# Patient Record
Sex: Female | Born: 1985 | Race: White | Hispanic: No | Marital: Married | Smoking: Never smoker
Health system: Southern US, Community
[De-identification: ages and names within clinical notes are randomized; demographics above are authoritative.]

## PROBLEM LIST (undated history)

## (undated) ENCOUNTER — Inpatient Hospital Stay (HOSPITAL_COMMUNITY): Payer: Self-pay

## (undated) DIAGNOSIS — Z789 Other specified health status: Secondary | ICD-10-CM

## (undated) HISTORY — PX: TONSILLECTOMY: SUR1361

---

## 2015-10-30 ENCOUNTER — Inpatient Hospital Stay (HOSPITAL_COMMUNITY)
Admission: AD | Admit: 2015-10-30 | Discharge: 2015-10-30 | Disposition: A | Discharge status: Home / Self Care | Payer: Medicaid Other | Source: Ambulatory Visit | Attending: Family Medicine | Admitting: Family Medicine

## 2015-10-30 ENCOUNTER — Inpatient Hospital Stay (HOSPITAL_COMMUNITY): Payer: Medicaid Other

## 2015-10-30 ENCOUNTER — Encounter (HOSPITAL_COMMUNITY): Payer: Self-pay | Admitting: *Deleted

## 2015-10-30 DIAGNOSIS — Z3A01 Less than 8 weeks gestation of pregnancy: Secondary | ICD-10-CM | POA: Diagnosis not present

## 2015-10-30 DIAGNOSIS — R109 Unspecified abdominal pain: Secondary | ICD-10-CM | POA: Diagnosis not present

## 2015-10-30 DIAGNOSIS — K5900 Constipation, unspecified: Secondary | ICD-10-CM | POA: Insufficient documentation

## 2015-10-30 DIAGNOSIS — O99611 Diseases of the digestive system complicating pregnancy, first trimester: Secondary | ICD-10-CM | POA: Insufficient documentation

## 2015-10-30 DIAGNOSIS — O9989 Other specified diseases and conditions complicating pregnancy, childbirth and the puerperium: Secondary | ICD-10-CM | POA: Diagnosis not present

## 2015-10-30 DIAGNOSIS — R1031 Right lower quadrant pain: Secondary | ICD-10-CM | POA: Diagnosis not present

## 2015-10-30 DIAGNOSIS — O26891 Other specified pregnancy related conditions, first trimester: Secondary | ICD-10-CM | POA: Diagnosis not present

## 2015-10-30 DIAGNOSIS — O26899 Other specified pregnancy related conditions, unspecified trimester: Secondary | ICD-10-CM

## 2015-10-30 DIAGNOSIS — Z3491 Encounter for supervision of normal pregnancy, unspecified, first trimester: Secondary | ICD-10-CM

## 2015-10-30 HISTORY — DX: Other specified health status: Z78.9

## 2015-10-30 LAB — URINALYSIS, ROUTINE W REFLEX MICROSCOPIC
BILIRUBIN URINE: NEGATIVE
GLUCOSE, UA: NEGATIVE mg/dL
Hgb urine dipstick: NEGATIVE
KETONES UR: NEGATIVE mg/dL
Nitrite: NEGATIVE
PH: 5.5 (ref 5.0–8.0)
PROTEIN: NEGATIVE mg/dL
Specific Gravity, Urine: 1.025 (ref 1.005–1.030)

## 2015-10-30 LAB — URINE MICROSCOPIC-ADD ON: RBC / HPF: NONE SEEN RBC/hpf (ref 0–5)

## 2015-10-30 LAB — CBC
HCT: 37.6 % (ref 36.0–46.0)
Hemoglobin: 13.3 g/dL (ref 12.0–15.0)
MCH: 29.5 pg (ref 26.0–34.0)
MCHC: 35.4 g/dL (ref 30.0–36.0)
MCV: 83.4 fL (ref 78.0–100.0)
PLATELETS: 194 10*3/uL (ref 150–400)
RBC: 4.51 MIL/uL (ref 3.87–5.11)
RDW: 13.2 % (ref 11.5–15.5)
WBC: 7.7 10*3/uL (ref 4.0–10.5)

## 2015-10-30 LAB — POCT PREGNANCY, URINE: Preg Test, Ur: POSITIVE — AB

## 2015-10-30 LAB — HCG, QUANTITATIVE, PREGNANCY: hCG, Beta Chain, Quant, S: 13171 m[IU]/mL — ABNORMAL HIGH (ref <5)

## 2015-10-30 MED ORDER — DOCUSATE SODIUM 100 MG PO CAPS: 100.0000 mg | ORAL_CAPSULE | Freq: Two times a day (BID) | ORAL | 0 refills | Status: DC

## 2015-10-30 NOTE — MAU Note (Signed)
Pt reports she is [redacted] weeks pregnant and has been having pain in her right lower abd , pain is sharp and goes and comes

## 2015-10-30 NOTE — Discharge Instructions (Signed)
Constipation, Adult Constipation is when a person:  Poops (has a bowel movement) less than 3 times a week.  Has a hard time pooping.  Has poop that is dry, hard, or bigger than normal. HOME CARE   Eat foods with a lot of fiber in them. This includes fruits, vegetables, beans, and whole grains such as brown rice.  Avoid fatty foods and foods with a lot of sugar. This includes french fries, hamburgers, cookies, candy, and soda.  If you are not getting enough fiber from food, take products with added fiber in them (supplements).  Drink enough fluid to keep your pee (urine) clear or pale yellow.  Exercise on a regular basis, or as told by your doctor.  Go to the restroom when you feel like you need to poop. Do not hold it.  Only take medicine as told by your doctor. Do not take medicines that help you poop (laxatives) without talking to your doctor first. GET HELP RIGHT AWAY IF:   You have bright red blood in your poop (stool).  Your constipation lasts more than 4 days or gets worse.  You have belly (abdominal) or butt (rectal) pain.  You have thin poop (as thin as a pencil).  You lose weight, and it cannot be explained. MAKE SURE YOU:   Understand these instructions.  Will watch your condition.  Will get help right away if you are not doing well or get worse.   This information is not intended to replace advice given to you by your health care provider. Make sure you discuss any questions you have with your health care provider.   Document Released: 08/30/2007 Document Revised: 04/03/2014 Document Reviewed: 12/23/2012 Elsevier Interactive Patient Education 2016 Elsevier Inc. Abdominal Pain During Pregnancy Belly (abdominal) pain is common during pregnancy. Most of the time, it is not a serious problem. Other times, it can be a sign that something is wrong with the pregnancy. Always tell your doctor if you have belly pain. HOME CARE Monitor your belly pain for any  changes. The following actions may help you feel better:  Do not have sex (intercourse) or put anything in your vagina until you feel better.  Rest until your pain stops.  Drink clear fluids if you feel sick to your stomach (nauseous). Do not eat solid food until you feel better.  Only take medicine as told by your doctor.  Keep all doctor visits as told. GET HELP RIGHT AWAY IF:   You are bleeding, leaking fluid, or pieces of tissue come out of your vagina.  You have more pain or cramping.  You keep throwing up (vomiting).  You have pain when you pee (urinate) or have blood in your pee.  You have a fever.  You do not feel your baby moving as much.  You feel very weak or feel like passing out.  You have trouble breathing, with or without belly pain.  You have a very bad headache and belly pain.  You have fluid leaking from your vagina and belly pain.  You keep having watery poop (diarrhea).  Your belly pain does not go away after resting, or the pain gets worse. MAKE SURE YOU:   Understand these instructions.  Will watch your condition.  Will get help right away if you are not doing well or get worse.   This information is not intended to replace advice given to you by your health care provider. Make sure you discuss any questions you have with your  health care provider.   Document Released: 03/01/2009 Document Revised: 11/13/2012 Document Reviewed: 10/10/2012 Elsevier Interactive Patient Education [employer]

## 2015-10-30 NOTE — MAU Provider Note (Signed)
History     CSN: [id number]  Arrival date and time: 10/30/15 2045   First Provider Initiated Contact with Patient 10/30/15 2227      Chief Complaint  Patient presents with  . Abdominal Pain   HPI  Sheryl Buck is a 30 y.o. [id number] at [redacted]w[redacted]d who presents with abdominal pain. Symptoms began 2.5 hours prior to arrival. Describes as sharp pain in RLQ that comes & goes. Rates pain 3/10. Has not treated. Denies aggravating or alleviating factors. Some nausea. Thinks she is constipated. Last BM was today but was small & difficult to pass. Has not treated constipation.  Denies vaginal bleeding, vaginal discharge, vomiting, fever/chills, or dysuria.  Plans on starting prenatal care with [facility] [place].  No recent intercourse.   OB History    Gravida Para Term Preterm AB Living   5 2 2   2 2    SAB TAB Ectopic Multiple Live Births   2              Past Medical History:  Diagnosis Date  . Medical history non-contributory     Past Surgical History:  Procedure Laterality Date  . NO PAST SURGERIES      History reviewed. No pertinent family history.  Social History  Substance Use Topics  . Smoking status: Never Smoker  . Smokeless tobacco: Never Used  . Alcohol use No    Allergies: No Known Allergies  Prescriptions Prior to Admission  Medication Sig Dispense Refill Last Dose  . Prenatal Vit-Fe Fumarate-FA (PRENATAL MULTIVITAMIN) TABS tablet Take 1 tablet by mouth daily at 12 noon.   10/30/2015 at Unknown time    Review of Systems  Constitutional: Negative.   Gastrointestinal: Positive for abdominal pain and constipation. Negative for blood in stool, diarrhea, nausea and vomiting.  Genitourinary: Negative.    Physical Exam   Blood pressure 107/73, pulse 86, temperature 97.7 F (36.5 C), temperature source Oral, resp. rate 17, last menstrual period 09/24/2015, SpO2 100 %.  Physical Exam  Nursing note and vitals reviewed. Constitutional: She is oriented to person, place,  and time. She appears well-developed and well-nourished. No distress.  HENT:  Head: Normocephalic and atraumatic.  Eyes: Conjunctivae are normal. Right eye exhibits no discharge. Left eye exhibits no discharge. No scleral icterus.  Neck: Normal range of motion.  Cardiovascular: Normal rate, regular rhythm and normal heart sounds.   No murmur heard. Respiratory: Effort normal and breath sounds normal. No respiratory distress. She has no wheezes.  GI: Soft. Bowel sounds are normal. She exhibits no distension. There is no tenderness. There is no rebound.  Genitourinary: Uterus normal. Cervix exhibits no motion tenderness. Right adnexum displays no mass, no tenderness and no fullness. Left adnexum displays no mass, no tenderness and no fullness.  Genitourinary Comments: Cervix closed  Neurological: She is alert and oriented to person, place, and time.  Skin: Skin is warm and dry. She is not diaphoretic.  Psychiatric: She has a normal mood and affect. Her behavior is normal. Judgment and thought content normal.    MAU Course  Procedures Results for orders placed or performed during the hospital encounter of 10/30/15 (from the past 24 hour(s))  Urinalysis, Routine w reflex microscopic (not at [facility])     Status: Abnormal   Collection Time: 10/30/15  9:00 PM  Result Value Ref Range   Color, Urine YELLOW YELLOW   APPearance CLEAR CLEAR   Specific Gravity, Urine 1.025 1.005 - 1.030   pH 5.5 5.0 - 8.0  Glucose, UA NEGATIVE NEGATIVE mg/dL   Hgb urine dipstick NEGATIVE NEGATIVE   Bilirubin Urine NEGATIVE NEGATIVE   Ketones, ur NEGATIVE NEGATIVE mg/dL   Protein, ur NEGATIVE NEGATIVE mg/dL   Nitrite NEGATIVE NEGATIVE   Leukocytes, UA TRACE (A) NEGATIVE  Urine microscopic-add on     Status: Abnormal   Collection Time: 10/30/15  9:00 PM  Result Value Ref Range   Squamous Epithelial / LPF 6-30 (A) NONE SEEN   WBC, UA 6-30 0 - 5 WBC/hpf   RBC / HPF NONE SEEN 0 - 5 RBC/hpf   Bacteria, UA FEW (A)  NONE SEEN  Pregnancy, urine POC     Status: Abnormal   Collection Time: 10/30/15  9:07 PM  Result Value Ref Range   Preg Test, Ur POSITIVE (A) NEGATIVE  CBC     Status: None   Collection Time: 10/30/15  9:20 PM  Result Value Ref Range   WBC 7.7 4.0 - 10.5 K/uL   RBC 4.51 3.87 - 5.11 MIL/uL   Hemoglobin 13.3 12.0 - 15.0 g/dL   HCT 13.0 86.5 - 78.4 %   MCV 83.4 78.0 - 100.0 fL   MCH 29.5 26.0 - 34.0 pg   MCHC 35.4 30.0 - 36.0 g/dL   RDW 69.6 29.5 - 28.4 %   Platelets 194 150 - 400 K/uL  ABO/Rh     Status: None   Collection Time: 10/30/15  9:20 PM  Result Value Ref Range   ABO/RH(D) B POS   hCG, quantitative, pregnancy     Status: Abnormal   Collection Time: 10/30/15  9:20 PM  Result Value Ref Range   hCG, Beta Chain, Quant, S 13,171 (H) <5 mIU/mL  HIV antibody     Status: None   Collection Time: 10/30/15  9:20 PM  Result Value Ref Range   HIV Screen 4th Generation wRfx Non Reactive Non Reactive   US Ob Comp Less 14 Wks  Addendum Date: 10/30/2015   ADDENDUM REPORT: 10/30/2015 22:51 ADDENDUM: This is a correction to the prior dictation, but incorrectly suggested that there was no yolk sac in the Impression section. The correct impression is as follows: Early intrauterine gestational sac, with yolk sac, but no fetal pole, or cardiac activity yet visualized. Recommend follow-up quantitative B-HCG levels and follow-up US in 14 days to confirm and assess viability. This recommendation follows SRU consensus guidelines: Diagnostic Criteria for Nonviable Pregnancy Early in the First Trimester. Malva Limes Med 2013; 132:4401-02. Electronically Signed   By: Trudie Reed M.D.   On: 10/30/2015 22:51   Result Date: 10/30/2015 CLINICAL DATA:  30 year old female with intermittent right lower quadrant pain since 8 p.m. Bold EXAM: OBSTETRIC <14 WK [place] AND TRANSVAGINAL OB US TECHNIQUE: Both transabdominal and transvaginal ultrasound examinations were performed for complete evaluation of the gestation as  well as the maternal uterus, adnexal regions, and pelvic cul-de-sac. Transvaginal technique was performed to assess early pregnancy. COMPARISON:  No priors. FINDINGS: Intrauterine gestational sac: Single Yolk sac:  Present Embryo:  No Cardiac Activity: No Heart Rate: N/A MSD: 9.9  mm   5 w   5  d Subchorionic hemorrhage:  None visualized. Maternal uterus/adnexae: Probable corpus luteum cyst in the right ovary. Left ovary is normal in appearance. No significant free fluid in the cul-de-sac. IMPRESSION: 1. Probable early intrauterine gestational sac, but no yolk sac, fetal pole, or cardiac activity yet visualized. Recommend follow-up quantitative B-HCG levels and follow-up US in 14 days to confirm and assess viability. This  recommendation follows SRU consensus guidelines: Diagnostic Criteria for Nonviable Pregnancy Early in the First Trimester. Malva Limes Med 2013; 735:6701-41. Electronically Signed: By: Trudie Reed M.D. On: 10/30/2015 22:33   US Ob Transvaginal  Addendum Date: 10/30/2015   ADDENDUM REPORT: 10/30/2015 22:51 ADDENDUM: This is a correction to the prior dictation, but incorrectly suggested that there was no yolk sac in the Impression section. The correct impression is as follows: Early intrauterine gestational sac, with yolk sac, but no fetal pole, or cardiac activity yet visualized. Recommend follow-up quantitative B-HCG levels and follow-up US in 14 days to confirm and assess viability. This recommendation follows SRU consensus guidelines: Diagnostic Criteria for Nonviable Pregnancy Early in the First Trimester. Malva Limes Med 2013; 030:1314-38. Electronically Signed   By: Trudie Reed M.D.   On: 10/30/2015 22:51   Result Date: 10/30/2015 CLINICAL DATA:  30 year old female with intermittent right lower quadrant pain since 8 p.m. Bold EXAM: OBSTETRIC <14 WK [place] AND TRANSVAGINAL OB US TECHNIQUE: Both transabdominal and transvaginal ultrasound examinations were performed for complete evaluation of  the gestation as well as the maternal uterus, adnexal regions, and pelvic cul-de-sac. Transvaginal technique was performed to assess early pregnancy. COMPARISON:  No priors. FINDINGS: Intrauterine gestational sac: Single Yolk sac:  Present Embryo:  No Cardiac Activity: No Heart Rate: N/A MSD: 9.9  mm   5 w   5  d Subchorionic hemorrhage:  None visualized. Maternal uterus/adnexae: Probable corpus luteum cyst in the right ovary. Left ovary is normal in appearance. No significant free fluid in the cul-de-sac. IMPRESSION: 1. Probable early intrauterine gestational sac, but no yolk sac, fetal pole, or cardiac activity yet visualized. Recommend follow-up quantitative B-HCG levels and follow-up US in 14 days to confirm and assess viability. This recommendation follows SRU consensus guidelines: Diagnostic Criteria for Nonviable Pregnancy Early in the First Trimester. Malva Limes Med 2013; 887:5797-28. Electronically Signed: By: Trudie Reed M.D. On: 10/30/2015 22:33    MDM UPT positive Ultrasound shows IUGS with yolk sac. Final reports impression states "probable IUGS, no yolk sac" but preliminary & findings of final report shows IUGS & yolk sac present. Spoke with radiologist who confirms that IUGS & yolk sac are presents & states the impression was incorrect; will addend final report.  Assessment and Plan  A: 1. Normal IUP (intrauterine pregnancy) on prenatal ultrasound, first trimester   2. Abdominal pain affecting pregnancy   3. Constipation, unspecified constipation type    P: Discharge home Rx colace Increase fiber & water intake Discussed reasons to return to MAU F/u with OB as scheduled   Judeth Horn 10/30/2015, 9:19 PM

## 2015-10-31 LAB — HIV ANTIBODY (ROUTINE TESTING W REFLEX): HIV SCREEN 4TH GENERATION: NONREACTIVE

## 2015-10-31 LAB — ABO/RH: ABO/RH(D): B POS

## 2015-11-16 ENCOUNTER — Ambulatory Visit (INDEPENDENT_AMBULATORY_CARE_PROVIDER_SITE_OTHER): Payer: Medicaid Other | Admitting: Obstetrics & Gynecology

## 2015-11-16 ENCOUNTER — Other Ambulatory Visit (HOSPITAL_COMMUNITY)
Admission: RE | Admit: 2015-11-16 | Discharge: 2015-11-16 | Disposition: A | Discharge status: Home / Self Care | Payer: Medicaid Other | Source: Ambulatory Visit | Attending: Family | Admitting: Family

## 2015-11-16 ENCOUNTER — Encounter: Payer: Self-pay | Admitting: Obstetrics & Gynecology

## 2015-11-16 VITALS — BP 122/84 | HR 88 | Ht 63.0 in | Wt 121.0 lb

## 2015-11-16 DIAGNOSIS — Z113 Encounter for screening for infections with a predominantly sexual mode of transmission: Secondary | ICD-10-CM

## 2015-11-16 DIAGNOSIS — Z3491 Encounter for supervision of normal pregnancy, unspecified, first trimester: Secondary | ICD-10-CM | POA: Diagnosis not present

## 2015-11-16 DIAGNOSIS — Z3481 Encounter for supervision of other normal pregnancy, first trimester: Secondary | ICD-10-CM

## 2015-11-16 DIAGNOSIS — Z3480 Encounter for supervision of other normal pregnancy, unspecified trimester: Secondary | ICD-10-CM | POA: Insufficient documentation

## 2015-11-16 NOTE — Progress Notes (Signed)
Bedside U/S show IUP with FHT of 158 BPM.  CRL is 16.229mm and GA is 8 weeks  [facility]  She will sign release to obtain.

## 2015-11-16 NOTE — Progress Notes (Signed)
  Subjective:    Sheryl Buck is a [id number] 5245w4d being seen today for her first obstetrical visit.  Her obstetrical history is significant for none. Patient does intend to breast feed. Pregnancy history fully reviewed.  Patient reports no complaints.  Vitals:   11/16/15 1435 11/16/15 1443  BP: 122/84   Pulse: 88   Weight: 121 lb (54.9 kg)   Height:  5\' 3"  (1.6 m)    HISTORY: OB History  Gravida Para Term Preterm AB Living  5 2 2   2 2   SAB TAB Ectopic Multiple Live Births  2            # Outcome Date GA Lbr Len/2nd Weight Sex Delivery Anes PTL Lv  5 Current           4 SAB  9172w0d         3 SAB  6872w0d         2 Term      Vag-Spont     1 Term      Vag-Spont        Past Medical History:  Diagnosis Date  . Medical history non-contributory    Past Surgical History:  Procedure Laterality Date  . NO PAST SURGERIES    . TONSILLECTOMY     Family History  Problem Relation Age of Onset  . Cancer Mother     cervical  . Cancer Father     neck  . Spina bifida Sister      Exam    Uterus:     Pelvic Exam:    Perineum: No Hemorrhoids   Vulva: normal   Vagina:  normal mucosa   pH:    Cervix: anteverted   Adnexa: normal adnexa   Bony Pelvis: android  System: Breast:  normal appearance, no masses or tenderness   Skin: normal coloration and turgor, no rashes    Neurologic: oriented   Extremities: normal strength, tone, and muscle mass   HEENT PERRLA   Mouth/Teeth mucous membranes moist, pharynx normal without lesions   Neck supple   Cardiovascular: regular rate and rhythm   Respiratory:  appears well, vitals normal, no respiratory distress, acyanotic, normal RR, ear and throat exam is normal, neck free of mass or lymphadenopathy, chest clear, no wheezing, crepitations, rhonchi, normal symmetric air entry   Abdomen: soft, non-tender; bowel sounds normal; no masses,  no organomegaly   Urinary: urethral meatus normal      Assessment:    Pregnancy: [id number] There  are no active problems to display for this patient.       Plan:     Initial labs drawn. Prenatal vitamins. Problem list reviewed and updated. Genetic Screening discussed Quad Screen: undecided.  Ultrasound discussed; fetal survey: requested.  Follow up in 4 weeks. Flu vaccine next visit  Allie BossierMyra C Teal Bontrager 11/16/2015

## 2015-11-17 LAB — OBSTETRIC PANEL
Antibody Screen: NEGATIVE
BASOS ABS: 0 {cells}/uL (ref 0–200)
BASOS PCT: 0 %
EOS ABS: 104 {cells}/uL (ref 15–500)
Eosinophils Relative: 1 %
HCT: 45.2 % — ABNORMAL HIGH (ref 35.0–45.0)
HEP B S AG: NEGATIVE
Hemoglobin: 15 g/dL (ref 11.7–15.5)
LYMPHS ABS: 1768 {cells}/uL (ref 850–3900)
Lymphocytes Relative: 17 %
MCH: 29.6 pg (ref 27.0–33.0)
MCHC: 33.2 g/dL (ref 32.0–36.0)
MCV: 89.3 fL (ref 80.0–100.0)
MONO ABS: 728 {cells}/uL (ref 200–950)
MPV: 10.9 fL (ref 7.5–12.5)
Monocytes Relative: 7 %
NEUTROS ABS: 7800 {cells}/uL (ref 1500–7800)
Neutrophils Relative %: 75 %
PLATELETS: 215 10*3/uL (ref 140–400)
RBC: 5.06 MIL/uL (ref 3.80–5.10)
RDW: 13.3 % (ref 11.0–15.0)
Rh Type: POSITIVE
Rubella: 7.33 Index — ABNORMAL HIGH (ref <0.90)
WBC: 10.4 10*3/uL (ref 3.8–10.8)

## 2015-11-17 LAB — HIV ANTIBODY (ROUTINE TESTING W REFLEX): HIV 1&2 Ab, 4th Generation: NONREACTIVE

## 2015-11-18 LAB — CULTURE, URINE COMPREHENSIVE: Organism ID, Bacteria: NO GROWTH

## 2015-11-18 LAB — URINE CYTOLOGY ANCILLARY ONLY
CHLAMYDIA, DNA PROBE: NEGATIVE
NEISSERIA GONORRHEA: NEGATIVE

## 2015-11-19 ENCOUNTER — Encounter: Payer: Self-pay | Admitting: Family

## 2015-11-23 ENCOUNTER — Encounter: Payer: Self-pay | Admitting: Obstetrics & Gynecology

## 2015-12-14 ENCOUNTER — Encounter (INDEPENDENT_AMBULATORY_CARE_PROVIDER_SITE_OTHER): Payer: Self-pay

## 2015-12-14 ENCOUNTER — Ambulatory Visit (INDEPENDENT_AMBULATORY_CARE_PROVIDER_SITE_OTHER): Payer: Medicaid Other | Admitting: Obstetrics & Gynecology

## 2015-12-14 ENCOUNTER — Encounter: Payer: Self-pay | Admitting: Obstetrics & Gynecology

## 2015-12-14 VITALS — BP 102/64 | HR 74 | Wt 122.0 lb

## 2015-12-14 DIAGNOSIS — Z3482 Encounter for supervision of other normal pregnancy, second trimester: Secondary | ICD-10-CM

## 2015-12-14 DIAGNOSIS — Z23 Encounter for immunization: Secondary | ICD-10-CM | POA: Diagnosis not present

## 2015-12-14 MED ORDER — SUMATRIPTAN SUCCINATE 100 MG PO TABS: 100.0000 mg | ORAL_TABLET | Freq: Once | ORAL | 11 refills | Status: DC | PRN

## 2015-12-14 NOTE — Progress Notes (Signed)
   PRENATAL VISIT NOTE  Subjective:  Sheryl Buck is a 30 y.o. [id number] at 6558w0d being seen today for ongoing prenatal care.  She is currently monitored for the following issues for this low-risk pregnancy and has Supervision of other normal pregnancy, antepartum on her problem list.  Patient reports daily migraines for the last 3 weeks, h/o migraines.  Contractions: Not present. Vag. Bleeding: None.  Movement: Absent. Denies leaking of fluid.   The following portions of the patient's history were reviewed and updated as appropriate: allergies, current medications, past family history, past medical history, past social history, past surgical history and problem list. Problem list updated.  Objective:   Vitals:   12/14/15 1450  BP: 102/64  Pulse: 74  Weight: 122 lb (55.3 kg)    Fetal Status: Fetal Heart Rate (bpm): 140   Movement: Absent     General:  Alert, oriented and cooperative. Patient is in no acute distress.  Skin: Skin is warm and dry. No rash noted.   Cardiovascular: Normal heart rate noted  Respiratory: Normal respiratory effort, no problems with respiration noted  Abdomen: Soft, gravid, appropriate for gestational age. Pain/Pressure: Absent     Pelvic:  Cervical exam deferred        Extremities: Normal range of motion.  Edema: None  Mental Status: Normal mood and affect. Normal behavior. Normal judgment and thought content.   Urinalysis: Urine Protein: Negative Urine Glucose: Negative  Assessment and Plan:  Pregnancy: [id number] at 4858w0d  1. Needs flu shot  - Flu Vaccine QUAD 36+ mos IM (Fluarix & Fluzone Quad PF - Imitrex - schedule anatomy u/s at 18 weeks  Preterm labor symptoms and general obstetric precautions including but not limited to vaginal bleeding, contractions, leaking of fluid and fetal movement were reviewed in detail with the patient. Please refer to After Visit Summary for other counseling recommendations.  No Follow-up on file.  Allie BossierMyra C Meryem Haertel,  MD

## 2015-12-14 NOTE — Progress Notes (Signed)
Pt states she's been experiencing headaches

## 2016-01-03 ENCOUNTER — Telehealth: Payer: Self-pay | Admitting: *Deleted

## 2016-01-03 DIAGNOSIS — B373 Candidiasis of vulva and vagina: Secondary | ICD-10-CM

## 2016-01-03 DIAGNOSIS — B3731 Acute candidiasis of vulva and vagina: Secondary | ICD-10-CM

## 2016-01-03 MED ORDER — TERCONAZOLE 0.8 % VA CREA: 1.0000 | TOPICAL_CREAM | Freq: Every day | VAGINAL | 0 refills | Status: DC

## 2016-01-03 NOTE — Telephone Encounter (Signed)
Pt called stating that she felt she had a yeast infection and would like RX to be sent to her pharmacy.  Per protocol she may have Terazol 3 be sent to her pharmacy.

## 2016-01-11 ENCOUNTER — Encounter: Payer: Self-pay | Admitting: Obstetrics & Gynecology

## 2016-01-11 ENCOUNTER — Encounter: Payer: Self-pay | Admitting: *Deleted

## 2016-01-11 ENCOUNTER — Ambulatory Visit (INDEPENDENT_AMBULATORY_CARE_PROVIDER_SITE_OTHER): Payer: Medicaid Other | Admitting: Obstetrics & Gynecology

## 2016-01-11 DIAGNOSIS — R51 Headache: Secondary | ICD-10-CM

## 2016-01-11 DIAGNOSIS — Z3482 Encounter for supervision of other normal pregnancy, second trimester: Secondary | ICD-10-CM | POA: Diagnosis not present

## 2016-01-11 DIAGNOSIS — O26892 Other specified pregnancy related conditions, second trimester: Secondary | ICD-10-CM | POA: Diagnosis not present

## 2016-01-11 DIAGNOSIS — Z3480 Encounter for supervision of other normal pregnancy, unspecified trimester: Secondary | ICD-10-CM

## 2016-01-11 DIAGNOSIS — R519 Headache, unspecified: Secondary | ICD-10-CM

## 2016-01-11 DIAGNOSIS — O26899 Other specified pregnancy related conditions, unspecified trimester: Secondary | ICD-10-CM

## 2016-01-11 MED ORDER — BUTALBITAL-APAP-CAFFEINE 50-325-40 MG PO CAPS: 1.0000 | ORAL_CAPSULE | Freq: Four times a day (QID) | ORAL | 3 refills | Status: DC | PRN

## 2016-01-11 NOTE — Progress Notes (Signed)
   PRENATAL VISIT NOTE  Subjective:  Sheryl Buck is a 30 y.o. [id number] at 3680w0d being seen today for ongoing prenatal care.  She is currently monitored for the following issues for this low-risk pregnancy and has Supervision of other normal pregnancy, antepartum and Headache in pregnancy, antepartum on her problem list.  Patient reports no complaints.  Contractions: Not present. Vag. Bleeding: None.  Movement: Absent. Denies leaking of fluid.   The following portions of the patient's history were reviewed and updated as appropriate: allergies, current medications, past family history, past medical history, past social history, past surgical history and problem list. Problem list updated.  Objective:   Vitals:   01/11/16 1434  BP: 101/65  Pulse: 84  Weight: 131 lb (59.4 kg)    Fetal Status: Fetal Heart Rate (bpm): 147   Movement: Absent     General:  Alert, oriented and cooperative. Patient is in no acute distress.  Skin: Skin is warm and dry. No rash noted.   Cardiovascular: Normal heart rate noted  Respiratory: Normal respiratory effort, no problems with respiration noted  Abdomen: Soft, gravid, appropriate for gestational age. Pain/Pressure: Absent     Pelvic:  Cervical exam deferred        Extremities: Normal range of motion.  Edema: None  Mental Status: Normal mood and affect. Normal behavior. Normal judgment and thought content.   Assessment and Plan:  Pregnancy: [id number] at 980w0d  1. Supervision of other normal pregnancy, antepartum Quad screen today Has US scheduled for 10/31  2. Headache in pregnancy, antepartum Fioricet  Total face-to-face time with patient was  20 min.  Greater than 50% was spent in counseling and coordination of care with the patient. Discussed genetic testing; breast pumps (pt wanted early Rx) and  Management of HA  Preterm labor symptoms and general obstetric precautions including but not limited to vaginal bleeding, contractions, leaking of  fluid and fetal movement were reviewed in detail with the patient. Please refer to After Visit Summary for other counseling recommendations.  No Follow-up on file.  Willodean Rosenthalarolyn Harraway-Smith, MD

## 2016-01-11 NOTE — Addendum Note (Signed)
Addended by: Granville LewisLARK, Kayona Foor L on: 01/11/2016 03:50 PM   Modules accepted: Orders

## 2016-01-11 NOTE — Patient Instructions (Signed)

## 2016-01-12 LAB — AFP, QUAD SCREEN
AFP: 43.6 ng/mL
Curr Gest Age: 16 weeks
HCG TOTAL: 48.52 [IU]/mL
INH: 183.5 pg/mL
Interpretation-AFP: NEGATIVE
MoM for AFP: 1.21
MoM for INH: 0.94
MoM for hCG: 1.15
OPEN SPINA BIFIDA: NEGATIVE
Tri 18 Scr Risk Est: NEGATIVE
Trisomy 18 (Edward) Syndrome Interp.: 1:17800 {titer}
UE3 MOM: 0.64
UE3 VALUE: 0.51 ng/mL

## 2016-01-18 ENCOUNTER — Encounter (HOSPITAL_COMMUNITY): Payer: Self-pay | Admitting: Obstetrics & Gynecology

## 2016-01-23 ENCOUNTER — Inpatient Hospital Stay (HOSPITAL_COMMUNITY)
Admission: AD | Admit: 2016-01-23 | Discharge: 2016-01-23 | Disposition: A | Discharge status: Home / Self Care | Payer: Medicaid Other | Source: Ambulatory Visit | Attending: Obstetrics & Gynecology | Admitting: Obstetrics & Gynecology

## 2016-01-23 ENCOUNTER — Encounter (HOSPITAL_COMMUNITY): Payer: Self-pay | Admitting: *Deleted

## 2016-01-23 DIAGNOSIS — Z3A17 17 weeks gestation of pregnancy: Secondary | ICD-10-CM | POA: Diagnosis not present

## 2016-01-23 DIAGNOSIS — Z3480 Encounter for supervision of other normal pregnancy, unspecified trimester: Secondary | ICD-10-CM

## 2016-01-23 DIAGNOSIS — Z3A18 18 weeks gestation of pregnancy: Secondary | ICD-10-CM

## 2016-01-23 DIAGNOSIS — R102 Pelvic and perineal pain: Secondary | ICD-10-CM

## 2016-01-23 DIAGNOSIS — O26892 Other specified pregnancy related conditions, second trimester: Secondary | ICD-10-CM

## 2016-01-23 DIAGNOSIS — R109 Unspecified abdominal pain: Secondary | ICD-10-CM | POA: Diagnosis present

## 2016-01-23 DIAGNOSIS — R51 Headache: Secondary | ICD-10-CM

## 2016-01-23 DIAGNOSIS — O26899 Other specified pregnancy related conditions, unspecified trimester: Secondary | ICD-10-CM

## 2016-01-23 LAB — URINALYSIS, ROUTINE W REFLEX MICROSCOPIC
BILIRUBIN URINE: NEGATIVE
GLUCOSE, UA: NEGATIVE mg/dL
Hgb urine dipstick: NEGATIVE
Ketones, ur: NEGATIVE mg/dL
Leukocytes, UA: NEGATIVE
NITRITE: NEGATIVE
PH: 7 (ref 5.0–8.0)
Protein, ur: NEGATIVE mg/dL
SPECIFIC GRAVITY, URINE: 1.02 (ref 1.005–1.030)

## 2016-01-23 NOTE — MAU Note (Signed)
Pt presents complaining of abdominal pain on both sides and the top of her stomach. Pt states the pain started two days ago and is sharp shooting, worse with sneezing or movement. Denies vaginal bleeding or leaking. Reports good fetal movement. Has not tried pain medicine.

## 2016-01-23 NOTE — MAU Provider Note (Signed)
History   [id number]   Chief Complaint  Patient presents with  . Abdominal Pain    HPI Sheryl Buck is a 30 y.o. female  [phone] at 3755w5d IUP here with report of bilateral groin pain and pain at fundal area of uterus with walking or turning in bed.  Denies vaginal bleeding or leaking of fluid.  Pain is described as sharp.  Pain is not present at this time.   Patient's last menstrual period was 09/24/2015.  OB History  Gravida Para Term Preterm AB Living  5 2 2   2 2   SAB TAB Ectopic Multiple Live Births  2            # Outcome Date GA Lbr Len/2nd Weight Sex Delivery Anes PTL Lv  5 Current           4 SAB  404w0d         3 SAB  [redacted]w[redacted]d         2 Term      Vag-Spont     1 Term      Vag-Spont         Past Medical History:  Diagnosis Date  . Medical history non-contributory     Family History  Problem Relation Age of Onset  . Cancer Mother     cervical  . Cancer Father     neck  . Spina bifida Sister     Social History   Social History  . Marital status: Married    Spouse name: N/A  . Number of children: N/A  . Years of education: N/A   Occupational History  . student    Social History Main Topics  . Smoking status: Never Smoker  . Smokeless tobacco: Never Used  . Alcohol use No  . Drug use: No  . Sexual activity: Yes    Partners: Male   Other Topics Concern  . None   Social History Narrative  . None    No Known Allergies  No current facility-administered medications on file prior to encounter.    Current Outpatient Prescriptions on File Prior to Encounter  Medication Sig Dispense Refill  . Butalbital-APAP-Caffeine 50-325-40 MG capsule Take 1-2 capsules by mouth every 6 (six) hours as needed for headache. 30 capsule 3  . docusate sodium (COLACE) 100 MG capsule Take 1 capsule (100 mg total) by mouth every 12 (twelve) hours. 60 capsule 0  . Prenatal Vit-Fe Fumarate-FA (PRENATAL MULTIVITAMIN) TABS tablet Take 1 tablet by mouth daily at 12 noon.    .  SUMAtriptan (IMITREX) 100 MG tablet Take 1 tablet (100 mg total) by mouth once as needed for migraine. May repeat in 2 hours if headache persists or recurs. 9 tablet 11  . terconazole (TERAZOL 3) 0.8 % vaginal cream Place 1 applicator vaginally at bedtime. 20 g 0     Review of Systems  Constitutional: Negative for fever.  Gastrointestinal: Positive for abdominal pain.  Genitourinary: Positive for pelvic pain. Negative for dysuria, flank pain, frequency, vaginal bleeding and vaginal discharge.  All other systems reviewed and are negative.    Physical Exam   Vitals:   01/23/16 2033  BP: 117/73  Pulse: 98  Resp: 18  Temp: 98 F (36.7 C)  TempSrc: Oral    Physical Exam  Constitutional: She is oriented to person, place, and time. She appears well-developed and well-nourished.  HENT:  Head: Normocephalic.  Neck: Normal range of motion. Neck supple.  Cardiovascular: Normal rate, regular rhythm and  normal heart sounds.   Respiratory: Effort normal and breath sounds normal. No respiratory distress.  GI: Soft. There is no tenderness.  Genitourinary: No bleeding in the vagina.  Musculoskeletal: Normal range of motion. She exhibits no edema.  Neurological: She is alert and oriented to person, place, and time.  Skin: Skin is warm and dry.    MAU Course  Procedures   Assessment and Plan  29 y.o. [id number] at 2385w5d IUP  Round Ligament Pain  Plan: Discharge home Provided reassurance Reviewed warning signs Keep scheduled appointment  Marlis EdelsonWalidah N Karim, CNM 01/23/2016 9:16 PM

## 2016-01-25 ENCOUNTER — Ambulatory Visit (HOSPITAL_COMMUNITY)
Admission: RE | Admit: 2016-01-25 | Discharge: 2016-01-25 | Disposition: A | Discharge status: Home / Self Care | Payer: Medicaid Other | Source: Ambulatory Visit | Attending: Obstetrics & Gynecology | Admitting: Obstetrics & Gynecology

## 2016-01-25 DIAGNOSIS — Z3A17 17 weeks gestation of pregnancy: Secondary | ICD-10-CM | POA: Diagnosis not present

## 2016-01-25 DIAGNOSIS — Z363 Encounter for antenatal screening for malformations: Secondary | ICD-10-CM | POA: Insufficient documentation

## 2016-01-25 DIAGNOSIS — Z3482 Encounter for supervision of other normal pregnancy, second trimester: Secondary | ICD-10-CM

## 2016-02-08 ENCOUNTER — Ambulatory Visit (INDEPENDENT_AMBULATORY_CARE_PROVIDER_SITE_OTHER): Payer: Medicaid Other | Admitting: Obstetrics & Gynecology

## 2016-02-08 VITALS — BP 103/65 | HR 90 | Wt 136.0 lb

## 2016-02-08 DIAGNOSIS — Z3480 Encounter for supervision of other normal pregnancy, unspecified trimester: Secondary | ICD-10-CM

## 2016-02-08 DIAGNOSIS — Z3482 Encounter for supervision of other normal pregnancy, second trimester: Secondary | ICD-10-CM

## 2016-02-08 NOTE — Progress Notes (Signed)
   PRENATAL VISIT NOTE  Subjective:  Sheryl Buck is a 30 y.o. [id number] at 6816w0d being seen today for ongoing prenatal care.  She is currently monitored for the following issues for this low-risk pregnancy and has Supervision of other normal pregnancy, antepartum and Headache in pregnancy, antepartum on her problem list.  Patient reports no complaints.  Contractions: Not present. Vag. Bleeding: None.  Movement: Present. Denies leaking of fluid.   The following portions of the patient's history were reviewed and updated as appropriate: allergies, current medications, past family history, past medical history, past social history, past surgical history and problem list. Problem list updated.  Objective:   Vitals:   02/08/16 1454  BP: 103/65  Pulse: 90  Weight: 136 lb (61.7 kg)    Fetal Status: Fetal Heart Rate (bpm): 147   Movement: Present     General:  Alert, oriented and cooperative. Patient is in no acute distress.  Skin: Skin is warm and dry. No rash noted.   Cardiovascular: Normal heart rate noted  Respiratory: Normal respiratory effort, no problems with respiration noted  Abdomen: Soft, gravid, appropriate for gestational age. Pain/Pressure: Present     Pelvic:  Cervical exam deferred        Extremities: Normal range of motion.  Edema: None  Mental Status: Normal mood and affect. Normal behavior. Normal judgment and thought content.   Assessment and Plan:  Pregnancy: [name] at 1016w0d  1. Supervision of other normal pregnancy, antepartum   Preterm labor symptoms and general obstetric precautions including but not limited to vaginal bleeding, contractions, leaking of fluid and fetal movement were reviewed in detail with the patient. Please refer to After Visit Summary for other counseling recommendations.  No Follow-up on file.   Allie BossierMyra C Nashia Remus, MD

## 2016-03-07 ENCOUNTER — Encounter: Payer: Medicaid Other | Admitting: Obstetrics & Gynecology

## 2016-03-10 ENCOUNTER — Ambulatory Visit (INDEPENDENT_AMBULATORY_CARE_PROVIDER_SITE_OTHER): Payer: Medicaid Other | Admitting: Advanced Practice Midwife

## 2016-03-10 VITALS — BP 97/64 | HR 86 | Wt 139.0 lb

## 2016-03-10 DIAGNOSIS — Z3482 Encounter for supervision of other normal pregnancy, second trimester: Secondary | ICD-10-CM

## 2016-03-10 DIAGNOSIS — O26892 Other specified pregnancy related conditions, second trimester: Secondary | ICD-10-CM

## 2016-03-10 DIAGNOSIS — Z3480 Encounter for supervision of other normal pregnancy, unspecified trimester: Secondary | ICD-10-CM

## 2016-03-10 DIAGNOSIS — R102 Pelvic and perineal pain: Secondary | ICD-10-CM

## 2016-03-10 DIAGNOSIS — O26899 Other specified pregnancy related conditions, unspecified trimester: Secondary | ICD-10-CM

## 2016-03-10 NOTE — Progress Notes (Signed)
   PRENATAL VISIT NOTE  Subjective:  Sheryl Buck is a 30 y.o. [id number] at 443w3d being seen today for ongoing prenatal care.  She is currently monitored for the following issues for this low-risk pregnancy and has Supervision of other normal pregnancy, antepartum and Headache in pregnancy, antepartum on her problem list.  Patient reports pain in groin area with walking/movement.  Contractions: Not present. Vag. Bleeding: None.  Movement: Present. Denies leaking of fluid.   The following portions of the patient's history were reviewed and updated as appropriate: allergies, current medications, past family history, past medical history, past social history, past surgical history and problem list. Problem list updated.  Objective:   Vitals:   03/10/16 1011  BP: 97/64  Pulse: 86  Weight: 139 lb (63 kg)    Fetal Status: Fetal Heart Rate (bpm): 151 Fundal Height: 25 cm Movement: Present     General:  Alert, oriented and cooperative. Patient is in no acute distress.  Skin: Skin is warm and dry. No rash noted.   Cardiovascular: Normal heart rate noted  Respiratory: Normal respiratory effort, no problems with respiration noted  Abdomen: Soft, gravid, appropriate for gestational age. Pain/Pressure: Present     Pelvic:  Cervical exam deferred        Extremities: Normal range of motion.  Edema: None  Mental Status: Normal mood and affect. Normal behavior. Normal judgment and thought content.   Assessment and Plan:  Pregnancy: [name] at 3843w3d  1. Supervision of other normal pregnancy, antepartum --Pt interested in waterbirth, printed materials given. Pt to take class.  2. Pain of round ligament affecting pregnancy, antepartum --Rest/ice/heat/Tylenol/pregnancy support belt  Preterm labor symptoms and general obstetric precautions including but not limited to vaginal bleeding, contractions, leaking of fluid and fetal movement were reviewed in detail with the patient. Please refer to After  Visit Summary for other counseling recommendations.  Return in about 4 weeks (around 04/07/2016).   Hurshel PartyLisa A Leftwich-Kirby, CNM

## 2016-03-10 NOTE — Progress Notes (Signed)
Pt. Complains of constant pain in groin area

## 2016-03-10 NOTE — Patient Instructions (Addendum)
Second Trimester of Pregnancy The second trimester is from week 13 through week 28 (months 4 through 6). The second trimester is often a time when you feel your best. Your body has also adjusted to being pregnant, and you begin to feel better physically. Usually, morning sickness has lessened or quit completely, you may have more energy, and you may have an increase in appetite. The second trimester is also a time when the fetus is growing rapidly. At the end of the sixth month, the fetus is about 9 inches long and weighs about 1 pounds. You will likely begin to feel the baby move (quickening) between 18 and 20 weeks of the pregnancy. Body changes during your second trimester Your body continues to go through many changes during your second trimester. The changes vary from woman to woman.  Your weight will continue to increase. You will notice your lower abdomen bulging out.  You may begin to get stretch marks on your hips, abdomen, and breasts.  You may develop headaches that can be relieved by medicines. The medicines should be approved by your health care provider.  You may urinate more often because the fetus is pressing on your bladder.  You may develop or continue to have heartburn as a result of your pregnancy.  You may develop constipation because certain hormones are causing the muscles that push waste through your intestines to slow down.  You may develop hemorrhoids or swollen, bulging veins (varicose veins).  You may have back pain. This is caused by:  Weight gain.  Pregnancy hormones that are relaxing the joints in your pelvis.  A shift in weight and the muscles that support your balance.  Your breasts will continue to grow and they will continue to become tender.  Your gums may bleed and may be sensitive to brushing and flossing.  Dark spots or blotches (chloasma, mask of pregnancy) may develop on your face. This will likely fade after the baby is born.  A dark line  from your belly button to the pubic area (linea nigra) may appear. This will likely fade after the baby is born.  You may have changes in your hair. These can include thickening of your hair, rapid growth, and changes in texture. Some women also have hair loss during or after pregnancy, or hair that feels dry or thin. Your hair will most likely return to normal after your baby is born. What to expect at prenatal visits During a routine prenatal visit:  You will be weighed to make sure you and the fetus are growing normally.  Your blood pressure will be taken.  Your abdomen will be measured to track your baby's growth.  The fetal heartbeat will be listened to.  Any test results from the previous visit will be discussed. Your health care provider may ask you:  How you are feeling.  If you are feeling the baby move.  If you have had any abnormal symptoms, such as leaking fluid, bleeding, severe headaches, or abdominal cramping.  If you are using any tobacco products, including cigarettes, chewing tobacco, and electronic cigarettes.  If you have any questions. Other tests that may be performed during your second trimester include:  Blood tests that check for:  Low iron levels (anemia).  Gestational diabetes (between 24 and 28 weeks).  Rh antibodies. This is to check for a protein on red blood cells (Rh factor).  Urine tests to check for infections, diabetes, or protein in the urine.  An ultrasound to  confirm the proper growth and development of the baby.  An amniocentesis to check for possible genetic problems.  Fetal screens for spina bifida and Down syndrome.  HIV (human immunodeficiency virus) testing. Routine prenatal testing includes screening for HIV, unless you choose not to have this test. Follow these instructions at home: Eating and drinking  Continue to eat regular, healthy meals.  Avoid raw meat, uncooked cheese, cat litter boxes, and soil used by cats. These  carry germs that can cause birth defects in the baby.  Take your prenatal vitamins.  Take 1500-2000 mg of calcium daily starting at the 20th week of pregnancy until you deliver your baby.  If you develop constipation:  Take over-the-counter or prescription medicines.  Drink enough fluid to keep your urine clear or pale yellow.  Eat foods that are high in fiber, such as fresh fruits and vegetables, whole grains, and beans.  Limit foods that are high in fat and processed sugars, such as fried and sweet foods. Activity  Exercise only as directed by your health care provider. Experiencing uterine cramps is a good sign to stop exercising.  Avoid heavy lifting, wear low heel shoes, and practice good posture.  Wear your seat belt at all times when driving.  Rest with your legs elevated if you have leg cramps or low back pain.  Wear a good support bra for breast tenderness.  Do not use hot tubs, steam rooms, or saunas. Lifestyle  Avoid all smoking, herbs, alcohol, and unprescribed drugs. These chemicals affect the formation and growth of the baby.  Do not use any products that contain nicotine or tobacco, such as cigarettes and e-cigarettes. If you need help quitting, ask your health care provider.  A sexual relationship may be continued unless your health care provider directs you otherwise. General instructions  Follow your health care provider's instructions regarding medicine use. There are medicines that are either safe or unsafe to take during pregnancy.  Take warm sitz baths to soothe any pain or discomfort caused by hemorrhoids. Use hemorrhoid cream if your health care provider approves.  If you develop varicose veins, wear support hose. Elevate your feet for 15 minutes, 3-4 times a day. Limit salt in your diet.  Visit your dentist if you have not gone yet during your pregnancy. Use a soft toothbrush to brush your teeth and be gentle when you floss.  Keep all follow-up  prenatal visits as told by your health care provider. This is important. Contact a health care provider if:  You have dizziness.  You have mild pelvic cramps, pelvic pressure, or nagging pain in the abdominal area.  You have persistent nausea, vomiting, or diarrhea.  You have a bad smelling vaginal discharge.  You have pain with urination. Get help right away if:  You have a fever.  You are leaking fluid from your vagina.  You have spotting or bleeding from your vagina.  You have severe abdominal cramping or pain.  You have rapid weight gain or weight loss.  You have shortness of breath with chest pain.  You notice sudden or extreme swelling of your face, hands, ankles, feet, or legs.  You have not felt your baby move in over an hour.  You have severe headaches that do not go away with medicine.  You have vision changes. Summary  The second trimester is from week 13 through week 28 (months 4 through 6). It is also a time when the fetus is growing rapidly.  Your body goes  through many changes during pregnancy. The changes vary from woman to woman.  Avoid all smoking, herbs, alcohol, and unprescribed drugs. These chemicals affect the formation and growth your baby.  Do not use any tobacco products, such as cigarettes, chewing tobacco, and e-cigarettes. If you need help quitting, ask your health care provider.  Contact your health care provider if you have any questions. Keep all prenatal visits as told by your health care provider. This is important. This information is not intended to replace advice given to you by your health care provider. Make sure you discuss any questions you have with your health care provider. Document Released: 03/07/2001 Document Revised: 08/19/2015 Document Reviewed: 05/14/2012 Elsevier Interactive Patient Education  2017 [place]? Guide for patients at [facility] [employer] [facility]  Why consider  waterbirth?  . Gentle birth for babies . Less pain medicine used in labor . May allow for passive descent/less pushing . May reduce perineal tears  . More mobility and instinctive maternal position changes . Increased maternal relaxation . Reduced blood pressure in labor  Is waterbirth safe? What are the risks of infection, drowning or other complications?  . Infection: o Very low risk (3.7 % for tub vs 4.8% for bed) o 7 in 8000 waterbirths with documented infection o Poorly cleaned equipment most common cause o Slightly lower group B strep transmission rate  . Drowning o Maternal:  - Very low risk   - Related to seizures or fainting o Newborn:  - Very low risk. No evidence of increased risk of respiratory problems in multiple large studies - Physiological protection from breathing under water - Avoid underwater birth if there are any fetal complications - Once baby's head is out of the water, keep it out.  . Birth complication o Some reports of cord trauma, but risk decreased by bringing baby to surface gradually o No evidence of increased risk of shoulder dystocia. Mothers can usually change positions faster in water than in a bed, possibly aiding the maneuvers to free the shoulder.   Am I a candidate for waterbirth?  Yes, if you are: . Full-term (37 weeks or greater)  . Have had an uncomplicated pregnancy and labor  No, if you have: Marland Kitchen Preterm birth less than 37 weeks . Thick, particulate meconium stained fluid . Maternal fever over 101 . Heavy bleeding or signs of placental abruption . Pre-eclampsia  . Any abnormal fetal heart rate pattern . Breech presentation . Twins  . Very large baby . Active communicable infection (this does NOT include group B strep) . Significant limitation to mobility  Please remember that birth is unpredictable. Under certain unforeseeable circumstances your provider may advise against giving birth in the tub. These decisions will be  made on a case-by-case basis and with the safety of you and your baby as our highest priority.  Requirements for patients planning waterbirth  . Ask your midwife if you will be a candidate for waterbirth. . Attend the [facility] at [phone] or [phone] for dates and times. The class is free and we strongly encourage you to bring your support person. You will receive a certificate of participation to show to your midwife or doctor. . Supplies needed for [facility] [employer] [facility] patients: o Single-use disposable tub liner (birthpoolinabox.com  REGULAR size) o New garden hose labeled "lead-free", "suitable for drinking water", "non-toxic" OR "water potable" o Garden hose to remove the dirty water o  Faucet adaptor to attach hose to faucet         o Electric drain pump to remove water (We recommend 792 gallon per hour or greater pump.)  o Fish net o Bathing suit top (optional) o Long-handled mirror (optional)  [employer] sells tubs for $120 if you would rather purchase your own tub    Www.waterbirthsolutions.com for tub purchases and supplies  [employer] (www.thelaborladies.com) $275 for tub rental/set-up & take down/kit   [employer] information regarding doulas (labor support) who provide pool rentals:  IdentityList.se.htm   [employer] [facility] (www.thelaborladies.com)  IdentityList.se.htm

## 2016-03-27 NOTE — L&D Delivery Note (Signed)
Delivery Note Pt admitted in active labor and progressed w/o augmentation. Called by RN when pt ant lip w/ urge to push. In waterbirth tub. Involuntary pushing w/ srom clear fluid ~195mins prior to birth. FHR  Dopplers q 5mins 130s-150s, briefly 100s. Then pushed a few more contractions and at 7:30 AM a viable female was delivered via Vaginal, Spontaneous Delivery (Presentation:LOA ). Infant slowly brought up from water.  Double nuchal reduced immediately after birth. APGAR: 8, 9; weight: pending at time of birth . Infant placed directly on mom's chest for bonding/skin-to-skin. Delayed cord clamping, then cord clamped x 2, and cut by fob.    Mom then assisted out of tub for delivery of placenta Placenta status: delivered spontaneously, intact .  Cord: 3VC with the following complications: none .  Cord pH: n/a  Anesthesia:  none Episiotomy: None Lacerations: None Suture Repair: n/a Est. Blood Loss (mL): 100  Mom to postpartum.  Baby to Couplet care / Skin to Skin.  Marge DuncansBooker, Poppy Mcafee Randall 06/21/2016, 7:56 AM

## 2016-04-07 ENCOUNTER — Ambulatory Visit (INDEPENDENT_AMBULATORY_CARE_PROVIDER_SITE_OTHER): Payer: Medicaid Other | Admitting: Advanced Practice Midwife

## 2016-04-07 ENCOUNTER — Other Ambulatory Visit (HOSPITAL_COMMUNITY)
Admission: RE | Admit: 2016-04-07 | Discharge: 2016-04-07 | Disposition: A | Discharge status: Home / Self Care | Payer: Medicaid Other | Source: Ambulatory Visit | Attending: Advanced Practice Midwife | Admitting: Advanced Practice Midwife

## 2016-04-07 VITALS — BP 106/64 | HR 89 | Wt 148.0 lb

## 2016-04-07 DIAGNOSIS — Z23 Encounter for immunization: Secondary | ICD-10-CM

## 2016-04-07 DIAGNOSIS — Z3A28 28 weeks gestation of pregnancy: Secondary | ICD-10-CM

## 2016-04-07 DIAGNOSIS — Z3493 Encounter for supervision of normal pregnancy, unspecified, third trimester: Secondary | ICD-10-CM | POA: Insufficient documentation

## 2016-04-07 DIAGNOSIS — Z3480 Encounter for supervision of other normal pregnancy, unspecified trimester: Secondary | ICD-10-CM

## 2016-04-07 LAB — CBC
HCT: 38 % (ref 35.0–45.0)
Hemoglobin: 12.6 g/dL (ref 11.7–15.5)
MCH: 29.5 pg (ref 27.0–33.0)
MCHC: 33.2 g/dL (ref 32.0–36.0)
MCV: 89 fL (ref 80.0–100.0)
MPV: 10.1 fL (ref 7.5–12.5)
PLATELETS: 191 10*3/uL (ref 140–400)
RBC: 4.27 MIL/uL (ref 3.80–5.10)
RDW: 13.2 % (ref 11.0–15.0)
WBC: 8.6 10*3/uL (ref 3.8–10.8)

## 2016-04-07 NOTE — Patient Instructions (Signed)
Third Trimester of Pregnancy The third trimester is from week 29 through week 40 (months 7 through 9). The third trimester is a time when the unborn baby (fetus) is growing rapidly. At the end of the ninth month, the fetus is about 20 inches in length and weighs 6-10 pounds. Body changes during your third trimester Your body goes through many changes during pregnancy. The changes vary from woman to woman. During the third trimester:  Your weight will continue to increase. You can expect to gain 25-35 pounds (11-16 kg) by the end of the pregnancy.  You may begin to get stretch marks on your hips, abdomen, and breasts.  You may urinate more often because the fetus is moving lower into your pelvis and pressing on your bladder.  You may develop or continue to have heartburn. This is caused by increased hormones that slow down muscles in the digestive tract.  You may develop or continue to have constipation because increased hormones slow digestion and cause the muscles that push waste through your intestines to relax.  You may develop hemorrhoids. These are swollen veins (varicose veins) in the rectum that can itch or be painful.  You may develop swollen, bulging veins (varicose veins) in your legs.  You may have increased body aches in the pelvis, back, or thighs. This is due to weight gain and increased hormones that are relaxing your joints.  You may have changes in your hair. These can include thickening of your hair, rapid growth, and changes in texture. Some women also have hair loss during or after pregnancy, or hair that feels dry or thin. Your hair will most likely return to normal after your baby is born.  Your breasts will continue to grow and they will continue to become tender. A yellow fluid (colostrum) may leak from your breasts. This is the first milk you are producing for your baby.  Your belly button may stick out.  You may notice more swelling in your hands, face, or  ankles.  You may have increased tingling or numbness in your hands, arms, and legs. The skin on your belly may also feel numb.  You may feel short of breath because of your expanding uterus.  You may have more problems sleeping. This can be caused by the size of your belly, increased need to urinate, and an increase in your body's metabolism.  You may notice the fetus "dropping," or moving lower in your abdomen.  You may have increased vaginal discharge.  Your cervix becomes thin and soft (effaced) near your due date. What to expect at prenatal visits You will have prenatal exams every 2 weeks until week 36. Then you will have weekly prenatal exams. During a routine prenatal visit:  You will be weighed to make sure you and the fetus are growing normally.  Your blood pressure will be taken.  Your abdomen will be measured to track your baby's growth.  The fetal heartbeat will be listened to.  Any test results from the previous visit will be discussed.  You may have a cervical check near your due date to see if you have effaced. At around 36 weeks, your health care provider will check your cervix. At the same time, your health care provider will also perform a test on the secretions of the vaginal tissue. This test is to determine if a type of bacteria, Group B streptococcus, is present. Your health care provider will explain this further. Your health care provider may ask you:    What your birth plan is.  How you are feeling.  If you are feeling the baby move.  If you have had any abnormal symptoms, such as leaking fluid, bleeding, severe headaches, or abdominal cramping.  If you are using any tobacco products, including cigarettes, chewing tobacco, and electronic cigarettes.  If you have any questions. Other tests or screenings that may be performed during your third trimester include:  Blood tests that check for low iron levels (anemia).  Fetal testing to check the health,  activity level, and growth of the fetus. Testing is done if you have certain medical conditions or if there are problems during the pregnancy.  Nonstress test (NST). This test checks the health of your baby to make sure there are no signs of problems, such as the baby not getting enough oxygen. During this test, a belt is placed around your belly. The baby is made to move, and its heart rate is monitored during movement. What is false labor? False labor is a condition in which you feel small, irregular tightenings of the muscles in the womb (contractions) that eventually go away. These are called Braxton Hicks contractions. Contractions may last for hours, days, or even weeks before true labor sets in. If contractions come at regular intervals, become more frequent, increase in intensity, or become painful, you should see your health care provider. What are the signs of labor?  Abdominal cramps.  Regular contractions that start at 10 minutes apart and become stronger and more frequent with time.  Contractions that start on the top of the uterus and spread down to the lower abdomen and back.  Increased pelvic pressure and dull back pain.  A watery or bloody mucus discharge that comes from the vagina.  Leaking of amniotic fluid. This is also known as your "water breaking." It could be a slow trickle or a gush. Let your doctor know if it has a color or strange odor. If you have any of these signs, call your health care provider right away, even if it is before your due date. Follow these instructions at home: Eating and drinking  Continue to eat regular, healthy meals.  Do not eat:  Raw meat or meat spreads.  Unpasteurized milk or cheese.  Unpasteurized juice.  Store-made salad.  Refrigerated smoked seafood.  Hot dogs or deli meat, unless they are piping hot.  More than 6 ounces of albacore tuna a week.  Shark, swordfish, king mackerel, or tile fish.  Store-made salads.  Raw  sprouts, such as mung bean or alfalfa sprouts.  Take prenatal vitamins as told by your health care provider.  Take 1000 mg of calcium daily as told by your health care provider.  If you develop constipation:  Take over-the-counter or prescription medicines.  Drink enough fluid to keep your urine clear or pale yellow.  Eat foods that are high in fiber, such as fresh fruits and vegetables, whole grains, and beans.  Limit foods that are high in fat and processed sugars, such as fried and sweet foods. Activity  Exercise only as directed by your health care provider. Healthy pregnant women should aim for 2 hours and 30 minutes of moderate exercise per week. If you experience any pain or discomfort while exercising, stop.  Avoid heavy lifting.  Do not exercise in extreme heat or humidity, or at high altitudes.  Wear low-heel, comfortable shoes.  Practice good posture.  Do not travel far distances unless it is absolutely necessary and only with the approval   of your health care provider.  Wear your seat belt at all times while in a car, on a bus, or on a plane.  Take frequent breaks and rest with your legs elevated if you have leg cramps or low back pain.  Do not use hot tubs, steam rooms, or saunas.  You may continue to have sex unless your health care provider tells you otherwise. Lifestyle  Do not use any products that contain nicotine or tobacco, such as cigarettes and e-cigarettes. If you need help quitting, ask your health care provider.  Do not drink alcohol.  Do not use any medicinal herbs or unprescribed drugs. These chemicals affect the formation and growth of the baby.  If you develop varicose veins:  Wear support pantyhose or compression stockings as told by your healthcare provider.  Elevate your feet for 15 minutes, 3-4 times a day.  Wear a supportive maternity bra to help with breast tenderness. General instructions  Take over-the-counter and prescription  medicines only as told by your health care provider. There are medicines that are either safe or unsafe to take during pregnancy.  Take warm sitz baths to soothe any pain or discomfort caused by hemorrhoids. Use hemorrhoid cream or witch hazel if your health care provider approves.  Avoid cat litter boxes and soil used by cats. These carry germs that can cause birth defects in the baby. If you have a cat, ask someone to clean the litter box for you.  To prepare for the arrival of your baby:  Take prenatal classes to understand, practice, and ask questions about the labor and delivery.  Make a trial run to the hospital.  Visit the hospital and tour the maternity area.  Arrange for maternity or paternity leave through employers.  Arrange for family and friends to take care of pets while you are in the hospital.  Purchase a rear-facing car seat and make sure you know how to install it in your car.  Pack your hospital bag.  Prepare the baby's nursery. Make sure to remove all pillows and stuffed animals from the baby's crib to prevent suffocation.  Visit your dentist if you have not gone during your pregnancy. Use a soft toothbrush to brush your teeth and be gentle when you floss.  Keep all prenatal follow-up visits as told by your health care provider. This is important. Contact a health care provider if:  You are unsure if you are in labor or if your water has broken.  You become dizzy.  You have mild pelvic cramps, pelvic pressure, or nagging pain in your abdominal area.  You have lower back pain.  You have persistent nausea, vomiting, or diarrhea.  You have an unusual or bad smelling vaginal discharge.  You have pain when you urinate. Get help right away if:  You have a fever.  You are leaking fluid from your vagina.  You have spotting or bleeding from your vagina.  You have severe abdominal pain or cramping.  You have rapid weight loss or weight gain.  You have  shortness of breath with chest pain.  You notice sudden or extreme swelling of your face, hands, ankles, feet, or legs.  Your baby makes fewer than 10 movements in 2 hours.  You have severe headaches that do not go away with medicine.  You have vision changes. Summary  The third trimester is from week 29 through week 40, months 7 through 9. The third trimester is a time when the unborn baby (fetus)   is growing rapidly.  During the third trimester, your discomfort may increase as you and your baby continue to gain weight. You may have abdominal, leg, and back pain, sleeping problems, and an increased need to urinate.  During the third trimester your breasts will keep growing and they will continue to become tender. A yellow fluid (colostrum) may leak from your breasts. This is the first milk you are producing for your baby.  False labor is a condition in which you feel small, irregular tightenings of the muscles in the womb (contractions) that eventually go away. These are called Braxton Hicks contractions. Contractions may last for hours, days, or even weeks before true labor sets in.  Signs of labor can include: abdominal cramps; regular contractions that start at 10 minutes apart and become stronger and more frequent with time; watery or bloody mucus discharge that comes from the vagina; increased pelvic pressure and dull back pain; and leaking of amniotic fluid. This information is not intended to replace advice given to you by your health care provider. Make sure you discuss any questions you have with your health care provider. Document Released: 03/07/2001 Document Revised: 08/19/2015 Document Reviewed: 05/14/2012 Elsevier Interactive Patient Education  2017 [employer]  

## 2016-04-07 NOTE — Progress Notes (Signed)
Pt states that the baby is "extremely low and breech" and is causing her pain and discomfort.

## 2016-04-07 NOTE — Progress Notes (Signed)
   PRENATAL VISIT NOTE  Subjective:  Sheryl Buck is a 31 y.o. [id number] at 4734w3d being seen today for ongoing prenatal care.  She is currently monitored for the following issues for this low-risk pregnancy and has Supervision of other normal pregnancy, antepartum and Headache in pregnancy, antepartum on her problem list.  Patient reports pelvic pressure, "feels like the baby is very low". Having sporadic contractions. Denies VB, LOF, urinary complaints. .  Contractions: Not present. Vag. Bleeding: None.  Movement: Present. Denies leaking of fluid.   The following portions of the patient's history were reviewed and updated as appropriate: allergies, current medications, past family history, past medical history, past social history, past surgical history and problem list. Problem list updated.  Objective:   Vitals:   04/07/16 0848  BP: 106/64  Pulse: 89  Weight: 148 lb (67.1 kg)    Fetal Status: Fetal Heart Rate (bpm): 150 Fundal Height: 29 cm Movement: Present     General:  Alert, oriented and cooperative. Patient is in no acute distress.  Skin: Skin is warm and dry. No rash noted.   Cardiovascular: Normal heart rate noted  Respiratory: Normal respiratory effort, no problems with respiration noted  Abdomen: Soft, gravid, appropriate for gestational age. Pain/Pressure: Present     Pelvic:  Cervical exam performed Dilation: Closed Effacement (%): 0 Station: Ballotable  Extremities: Normal range of motion.  Edema: None  Mental Status: Normal mood and affect. Normal behavior. Normal judgment and thought content.   Unable to void.  Assessment and Plan:  Pregnancy: [id number] at 834w3d  1. [redacted] weeks gestation of pregnancy  - Glucose Tolerance, 2 Hours w/1 Hour - CBC - HIV antibody (with reflex) - RPR - Tdap vaccine greater than or equal to 7yo IM - Cervicovaginal ancillary only  2. Normal pregnancy in third trimester  - Glucose Tolerance, 2 Hours w/1 Hour - CBC - HIV antibody  (with reflex) - RPR - Tdap vaccine greater than or equal to 7yo IM  3. Pelvic pressure - Cervicovaginal ancillary only  4. Supervision of other normal pregnancy, antepartum   Preterm labor symptoms and general obstetric precautions including but not limited to vaginal bleeding, contractions, leaking of fluid and fetal movement were reviewed in detail with the patient. Please refer to After Visit Summary for other counseling recommendations.  Return in 2 weeks (on 04/21/2016).   Sheryl Buck, CNM

## 2016-04-08 LAB — GLUCOSE TOLERANCE, 2 HOURS W/ 1HR
GLUCOSE, 2 HOUR: 93 mg/dL (ref <140)
Glucose, 1 hour: 131 mg/dL
Glucose, Fasting: 78 mg/dL (ref 65–99)

## 2016-04-08 LAB — RPR

## 2016-04-08 LAB — HIV ANTIBODY (ROUTINE TESTING W REFLEX): HIV: NONREACTIVE

## 2016-04-10 LAB — CERVICOVAGINAL ANCILLARY ONLY
BACTERIAL VAGINITIS: NEGATIVE
CANDIDA VAGINITIS: NEGATIVE
TRICH (WINDOWPATH): NEGATIVE

## 2016-04-21 ENCOUNTER — Ambulatory Visit (INDEPENDENT_AMBULATORY_CARE_PROVIDER_SITE_OTHER): Payer: Medicaid Other | Admitting: Advanced Practice Midwife

## 2016-04-21 DIAGNOSIS — Z3483 Encounter for supervision of other normal pregnancy, third trimester: Secondary | ICD-10-CM

## 2016-04-21 DIAGNOSIS — Z3480 Encounter for supervision of other normal pregnancy, unspecified trimester: Secondary | ICD-10-CM

## 2016-04-21 NOTE — Progress Notes (Signed)
   PRENATAL VISIT NOTE  Subjective:  Sheryl Buck is a 31 y.o. [id number] at 6015w3d being seen today for ongoing prenatal care.  She is currently monitored for the following issues for this low-risk pregnancy and has Supervision of other normal pregnancy, antepartum and Headache in pregnancy, antepartum on her problem list.  Patient reports URI Sx x 1 week.  Contractions: Irregular. Vag. Bleeding: None.  Movement: Present. Denies leaking of fluid.   The following portions of the patient's history were reviewed and updated as appropriate: allergies, current medications, past family history, past medical history, past social history, past surgical history and problem list. Problem list updated.  Objective:   Vitals:   04/21/16 1022  BP: 112/64  Pulse: 90  Weight: 152 lb (68.9 kg)    Fetal Status: Fetal Heart Rate (bpm): 137 Fundal Height: 30 cm Movement: Present     General:  Alert, oriented and cooperative. Patient is in no acute distress.  Skin: Skin is warm and dry. No rash noted.   Cardiovascular: Normal heart rate noted  Respiratory: Normal respiratory effort, no problems with respiration noted  Abdomen: Soft, gravid, appropriate for gestational age. Pain/Pressure: Present     Pelvic:  Cervical exam deferred        Extremities: Normal range of motion.  Edema: Trace  Mental Status: Normal mood and affect. Normal behavior. Normal judgment and thought content.   Assessment and Plan:  Pregnancy: [name] at 5815w3d  There are no diagnoses linked to this encounter. Preterm labor symptoms and general obstetric precautions including but not limited to vaginal bleeding, contractions, leaking of fluid and fetal movement were reviewed in detail with the patient. Please refer to After Visit Summary for other counseling recommendations.  OTC med list given Return in about 2 weeks (around 05/05/2016).   Sheryl KinsmanVirginia Joanthony Hamza, CNM

## 2016-04-21 NOTE — Patient Instructions (Signed)

## 2016-05-04 ENCOUNTER — Emergency Department (HOSPITAL_BASED_OUTPATIENT_CLINIC_OR_DEPARTMENT_OTHER): Payer: Medicaid Other

## 2016-05-04 ENCOUNTER — Encounter (HOSPITAL_BASED_OUTPATIENT_CLINIC_OR_DEPARTMENT_OTHER): Payer: Self-pay | Admitting: *Deleted

## 2016-05-04 ENCOUNTER — Emergency Department (HOSPITAL_BASED_OUTPATIENT_CLINIC_OR_DEPARTMENT_OTHER)
Admission: EM | Admit: 2016-05-04 | Discharge: 2016-05-04 | Disposition: A | Discharge status: Home / Self Care | Payer: Medicaid Other | Attending: Emergency Medicine | Admitting: Emergency Medicine

## 2016-05-04 DIAGNOSIS — O26893 Other specified pregnancy related conditions, third trimester: Secondary | ICD-10-CM | POA: Diagnosis not present

## 2016-05-04 DIAGNOSIS — M79672 Pain in left foot: Secondary | ICD-10-CM | POA: Insufficient documentation

## 2016-05-04 DIAGNOSIS — Z3A32 32 weeks gestation of pregnancy: Secondary | ICD-10-CM | POA: Diagnosis not present

## 2016-05-04 MED ORDER — ACETAMINOPHEN 325 MG PO TABS
650.0000 mg | ORAL_TABLET | Freq: Once | ORAL | Status: AC
  Administered 2016-05-04: 650 mg via ORAL
  Filled 2016-05-04: qty 2

## 2016-05-04 NOTE — ED Triage Notes (Signed)
Pain on the left side of her foot for 4 days. No known injury. She is [redacted] weeks pregnant.

## 2016-05-04 NOTE — Discharge Instructions (Signed)
Please read and follow all provided instructions.  Your diagnoses today include:  1. Foot pain, left     Tests performed today include:  An x-ray of the affected area - does NOT show any broken bones  Vital signs. See below for your results today.   Medications prescribed:   Tylenol  Take any prescribed medications only as directed.  Home care instructions:   Follow any educational materials contained in this packet  Follow R.I.C.E. Protocol:  R - rest your injury   I  - use ice on injury without applying directly to skin  C - compress injury with bandage or splint  E - elevate the injury as much as possible  Follow-up instructions: Please follow-up with your primary care provider or the provided orthopedic physician (bone specialist) if you continue to have significant pain in 1 week. In this case you may have a more severe injury that requires further care.   Return instructions:   Please return if your toes or feet are numb or tingling, appear gray or blue, or you have severe pain (also elevate the leg and loosen splint or wrap if you were given one)  Please return to the Emergency Department if you experience worsening symptoms.   Please return if you have any other emergent concerns.  Additional Information:  Your vital signs today were: BP 99/66 (BP Location: Right Arm)    Pulse 81    Temp 98.1 F (36.7 C) (Oral)    Resp 16    Ht 5\' 3"  (1.6 m)    Wt 70.3 kg    LMP 09/24/2015    SpO2 100%    BMI 27.46 kg/m  If your blood pressure (BP) was elevated above 135/85 this visit, please have this repeated by your doctor within one month. --------------

## 2016-05-04 NOTE — ED Provider Notes (Signed)
[facility] Provider Note   CSN: [id number] Arrival date & time: 05/04/16  1324  By signing my name below, I, Rosario AdieWilliam Andrew Hiatt, attest that this documentation has been prepared under the direction and in the presence of Renne CriglerJoshua Dakiya Puopolo, PA-C.  Electronically Signed: Rosario AdieWilliam Andrew Hiatt, ED Scribe. 05/04/16. 4:29 PM.  History   Chief Complaint Chief Complaint  Patient presents with  . Foot Pain   The history is provided by the patient. No language interpreter was used.    HPI Comments: Sheryl Buck is a [id number] 31 y.o. female who is 5320w2d pregnant, with no pertinent PMHx, who presents to the Emergency Department complaining of sudden onset, gradually worsening left lateral foot pain beginning four days ago. Pt reports that she woke up without her pain four days ago, and upon taking one step out of bed that she felt a sudden, sharp sensation up into her left leg. Her pain has been constant since. No known injury or trauma to the foot to precipitate her pain. No recent change in footwear. She has been taking Tylenol at home without relief of her pain. Her pain is exacerbated with flexion, weight bearing, and ambulation, and her pain is mildly alleviated with non-weight bearing and resting the foot. Pt denies numbness, paraesthesias, knee pain, or any other associated symptoms.   Past Medical History:  Diagnosis Date  . Medical history non-contributory    Patient Active Problem List   Diagnosis Date Noted  . Headache in pregnancy, antepartum 01/11/2016  . Supervision of other normal pregnancy, antepartum 11/16/2015   Past Surgical History:  Procedure Laterality Date  . NO PAST SURGERIES    . TONSILLECTOMY     OB History    Gravida Para Term Preterm AB Living   5 2 2   2 2    SAB TAB Ectopic Multiple Live Births   2             Home Medications    Prior to Admission medications   Medication Sig Start Date End Date Taking? Authorizing Provider    Butalbital-APAP-Caffeine 50-325-40 MG capsule Take 1-2 capsules by mouth every 6 (six) hours as needed for headache. Patient not taking: Reported on 04/21/2016 01/11/16   Willodean Rosenthalarolyn Harraway-Smith, MD  docusate sodium (COLACE) 100 MG capsule Take 1 capsule (100 mg total) by mouth every 12 (twelve) hours. Patient not taking: Reported on 04/21/2016 10/30/15   Judeth HornErin Lawrence, NP  Prenatal Vit-Fe Fumarate-FA (PRENATAL MULTIVITAMIN) TABS tablet Take 1 tablet by mouth daily at 12 noon.    Historical Provider, MD  SUMAtriptan (IMITREX) 100 MG tablet Take 1 tablet (100 mg total) by mouth once as needed for migraine. May repeat in 2 hours if headache persists or recurs. Patient not taking: Reported on 04/21/2016 12/14/15   Allie BossierMyra C Dove, MD   Family History Family History  Problem Relation Age of Onset  . Cancer Mother     cervical  . Cancer Father     neck  . Spina bifida Sister    Social History Social History  Substance Use Topics  . Smoking status: Never Smoker  . Smokeless tobacco: Never Used  . Alcohol use No   Allergies   Patient has no known allergies.  Review of Systems Review of Systems  Constitutional: Negative for activity change.  Musculoskeletal: Positive for arthralgias, gait problem and myalgias (left foot). Negative for back pain, joint swelling and neck pain.  Skin: Negative for wound.  Neurological: Negative for weakness and numbness.  Negative for paraesthesias.    Physical Exam Updated Vital Signs BP 99/66 (BP Location: Right Arm)   Pulse 81   Temp 98.1 F (36.7 C) (Oral)   Resp 16   Ht 5\' 3"  (1.6 m)   Wt 155 lb (70.3 kg)   LMP 09/24/2015   SpO2 100%   BMI 27.46 kg/m   Physical Exam  Constitutional: She appears well-developed and well-nourished. No distress.  HENT:  Head: Normocephalic and atraumatic.  Eyes: Conjunctivae are normal. Pupils are equal, round, and reactive to light.  Neck: Normal range of motion. Neck supple.  Cardiovascular: Normal rate and  normal pulses.  Exam reveals no decreased pulses.   Pulmonary/Chest: Effort normal.  Abdominal: She exhibits no distension.  Musculoskeletal: Normal range of motion. She exhibits tenderness. She exhibits no edema.       Left knee: Normal.       Left ankle: Normal. No tenderness.       Left foot: There is tenderness. There is normal range of motion, no bony tenderness and no swelling.       Feet:  Neurological: She is alert. No sensory deficit.  Motor, sensation, and vascular distal to the injury is fully intact.   Skin: Skin is warm and dry. No pallor.  Psychiatric: She has a normal mood and affect. Her behavior is normal.  Nursing note and vitals reviewed.  ED Treatments / Results  DIAGNOSTIC STUDIES: Oxygen Saturation is 100% on RA, normal by my interpretation.   COORDINATION OF CARE: 4:29 PM-Discussed next steps with pt including resting, ice, heat elevation and home symptomatic treatment. Will also refer into orthopedics if her pain does not improve. She declined crutches. Pt verbalized understanding and is agreeable with the plan.   Labs (all labs ordered are listed, but only abnormal results are displayed) Labs Reviewed - No data to display  EKG  EKG Interpretation None      Radiology Dg Foot Complete Left  Result Date: 05/04/2016 CLINICAL DATA:  Pain for 3 days EXAM: LEFT FOOT - COMPLETE 3+ VIEW COMPARISON:  None. FINDINGS: Frontal, oblique, and lateral views obtained. There is no fracture or dislocation. Joint spaces appear normal. No erosive change. IMPRESSION: No fracture or dislocation.  No evident arthropathy. Electronically Signed   By: Bretta Bang III M.D.   On: 05/04/2016 13:58   Procedures Procedures   Medications Ordered in ED Medications  acetaminophen (TYLENOL) tablet 650 mg (650 mg Oral Given 05/04/16 1346)   Initial Impression / Assessment and Plan / ED Course  I have reviewed the triage vital signs and the nursing notes.  Pertinent labs &  imaging results that were available during my care of the patient were reviewed by me and considered in my medical decision making (see chart for details).     Vital signs reviewed and are as follows: Vitals:   05/04/16 1329  BP: 99/66  Pulse: 81  Resp: 16  Temp: 98.1 F (36.7 C)    Patient was counseled on RICE protocol and told to rest injury, use ice for no longer than 15 minutes every hour, compress the area, and elevate above the level of their heart as much as possible to reduce swelling. Questions answered. Patient verbalized understanding.     Final Clinical Impressions(s) / ED Diagnoses   Final diagnoses:  Foot pain, left   Patient with foot pain, no injury, x-rays negative. No signs of infection. Conservative measures indicated with orthopedic follow-up if not improved. Lower extremitity neurovascularly  intact.  New Prescriptions Current Discharge Medication List     I personally performed the services described in this documentation, which was scribed in my presence. The recorded information has been reviewed and is accurate.     Renne Crigler, PA-C 05/04/16 1649    Canary Brim Tegeler, MD 05/05/16 [phone]

## 2016-05-05 ENCOUNTER — Ambulatory Visit (HOSPITAL_COMMUNITY)
Admission: RE | Admit: 2016-05-05 | Discharge: 2016-05-05 | Disposition: A | Discharge status: Home / Self Care | Payer: Medicaid Other | Source: Ambulatory Visit | Attending: Advanced Practice Midwife | Admitting: Advanced Practice Midwife

## 2016-05-05 ENCOUNTER — Telehealth: Payer: Self-pay | Admitting: Advanced Practice Midwife

## 2016-05-05 ENCOUNTER — Encounter: Payer: Self-pay | Admitting: Advanced Practice Midwife

## 2016-05-05 ENCOUNTER — Ambulatory Visit (INDEPENDENT_AMBULATORY_CARE_PROVIDER_SITE_OTHER): Payer: Medicaid Other | Admitting: Advanced Practice Midwife

## 2016-05-05 VITALS — BP 103/60 | HR 83 | Wt 155.0 lb

## 2016-05-05 DIAGNOSIS — M79662 Pain in left lower leg: Secondary | ICD-10-CM

## 2016-05-05 DIAGNOSIS — Z3480 Encounter for supervision of other normal pregnancy, unspecified trimester: Secondary | ICD-10-CM

## 2016-05-05 DIAGNOSIS — Z3483 Encounter for supervision of other normal pregnancy, third trimester: Secondary | ICD-10-CM

## 2016-05-05 NOTE — Patient Instructions (Signed)
Braxton Hicks Contractions °Contractions of the uterus can occur throughout pregnancy. Contractions are not always a sign that you are in labor.  °WHAT ARE BRAXTON HICKS CONTRACTIONS?  °Contractions that occur before labor are called Braxton Hicks contractions, or false labor. Toward the end of pregnancy (32-34 weeks), these contractions can develop more often and may become more forceful. This is not true labor because these contractions do not result in opening (dilatation) and thinning of the cervix. They are sometimes difficult to tell apart from true labor because these contractions can be forceful and people have different pain tolerances. You should not feel embarrassed if you go to the hospital with false labor. Sometimes, the only way to tell if you are in true labor is for your health care provider to look for changes in the cervix. °If there are no prenatal problems or other health problems associated with the pregnancy, it is completely safe to be sent home with false labor and await the onset of true labor. °HOW CAN YOU TELL THE DIFFERENCE BETWEEN TRUE AND FALSE LABOR? °False Labor  °· The contractions of false labor are usually shorter and not as hard as those of true labor.   °· The contractions are usually irregular.   °· The contractions are often felt in the front of the lower abdomen and in the groin.   °· The contractions may go away when you walk around or change positions while lying down.   °· The contractions get weaker and are shorter lasting as time goes on.   °· The contractions do not usually become progressively stronger, regular, and closer together as with true labor.   °True Labor  °· Contractions in true labor last 30-70 seconds, become very regular, usually become more intense, and increase in frequency.   °· The contractions do not go away with walking.   °· The discomfort is usually felt in the top of the uterus and spreads to the lower abdomen and low back.   °· True labor can be  determined by your health care provider with an exam. This will show that the cervix is dilating and getting thinner.   °WHAT TO REMEMBER °· Keep up with your usual exercises and follow other instructions given by your health care provider.   °· Take medicines as directed by your health care provider.   °· Keep your regular prenatal appointments.   °· Eat and drink lightly if you think you are going into labor.   °· If Braxton Hicks contractions are making you uncomfortable:   °¨ Change your position from lying down or resting to walking, or from walking to resting.   °¨ Sit and rest in a tub of warm water.   °¨ Drink 2-3 glasses of water. Dehydration may cause these contractions.   °¨ Do slow and deep breathing several times an hour.   °WHEN SHOULD I SEEK IMMEDIATE MEDICAL CARE? °Seek immediate medical care if: °· Your contractions become stronger, more regular, and closer together.   °· You have fluid leaking or gushing from your vagina.   °· You have a fever.   °· You pass blood-tinged mucus.   °· You have vaginal bleeding.   °· You have continuous abdominal pain.   °· You have low back pain that you never had before.   °· You feel your baby's head pushing down and causing pelvic pressure.   °· Your baby is not moving as much as it used to.   °This information is not intended to replace advice given to you by your health care provider. Make sure you discuss any questions you have with your health care   provider. °Document Released: 03/13/2005 Document Revised: 07/05/2015 Document Reviewed: 12/23/2012 °Elsevier Interactive Patient Education © 2017 [employer] ° °

## 2016-05-05 NOTE — Telephone Encounter (Signed)
Called to inform pt of normal prelim results of lower extremity dopplers. Discussed comfort measures. May consider Ortho, PT or Chiro referral PRN.

## 2016-05-05 NOTE — Progress Notes (Signed)
Pt states that her left leg as been causing pain and she can hardly walk on her left foot. She went to urgent care and they did not find anything wrong with her foot.

## 2016-05-05 NOTE — Progress Notes (Signed)
   PRENATAL VISIT NOTE  Subjective:  Sheryl Buck is a 31 y.o. [id number] at 6012w3d being seen today for ongoing prenatal care.  She is currently monitored for the following issues for this low-risk pregnancy and has Supervision of other normal pregnancy, antepartum and Headache in pregnancy, antepartum on her problem list.  Patient reports left foot and leg pain and swelling x 4 days.  No injury. Also having mild left low back pain. Pain radiates up left calf.  Was seen at [facility]. No dopplers done. Denies SOB, hemoptysis.   Contractions: Irregular. Vag. Bleeding: None.  Movement: Present. Denies leaking of fluid.   The following portions of the patient's history were reviewed and updated as appropriate: allergies, current medications, past family history, past medical history, past social history, past surgical history and problem list. Problem list updated.  Objective:   Vitals:   05/05/16 0942  BP: 103/60  Pulse: 83  Weight: 155 lb (70.3 kg)    Fetal Status: Fetal Heart Rate (bpm): 133 Fundal Height: 33 cm Movement: Present  Presentation: Vertex  General:  Alert, oriented and cooperative. Patient is in no acute distress.  Skin: Skin is warm and dry. No rash noted.   Cardiovascular: Normal heart rate noted  Respiratory: Normal respiratory effort, no problems with respiration noted  Abdomen: Soft, gravid, appropriate for gestational age. Pain/Pressure: Present     Pelvic:  Cervical exam deferred        Extremities: Normal range of motion.  Edema: Trace, L>R. Left calf TTP. No cords or erythema.   Mental Status: Normal mood and affect. Normal behavior. Normal judgment and thought content.   Assessment and Plan:  Pregnancy: [name] at 3112w3d  1. Supervision of other normal pregnancy, antepartum   2. Pain of left calf  - VAS US LOWER EXTREMITY VENOUS (DVT); Future  Preterm labor symptoms and general obstetric precautions including but not limited to vaginal bleeding,  contractions, leaking of fluid and fetal movement were reviewed in detail with the patient. Please refer to After Visit Summary for other counseling recommendations.  Sheryl KinsmanVirginia Mohini Heathcock CNM will call pt directly w/ results.  Dr. Macon LargeAnyanwu aware.  Return in 2 weeks (on 05/19/2016).   Sheryl KinsmanVirginia Tania Perrott, CNM

## 2016-05-05 NOTE — Progress Notes (Signed)
*  PRELIMINARY RESULTS* Vascular Ultrasound Bilateral lower extremity venous duplex has been completed.  Preliminary findings: No evidence of deep vein thrombosis or baker's cysts bilaterally.  Preliminary results called to [facility] @ 12:00   Sheryl FischerCharlotte C Bradd Buck 05/05/2016, 12:03 PM

## 2016-05-19 ENCOUNTER — Ambulatory Visit (INDEPENDENT_AMBULATORY_CARE_PROVIDER_SITE_OTHER): Payer: Medicaid Other | Admitting: Advanced Practice Midwife

## 2016-05-19 VITALS — BP 110/73 | HR 105 | Wt 157.0 lb

## 2016-05-19 DIAGNOSIS — Z3483 Encounter for supervision of other normal pregnancy, third trimester: Secondary | ICD-10-CM

## 2016-05-19 DIAGNOSIS — Z3480 Encounter for supervision of other normal pregnancy, unspecified trimester: Secondary | ICD-10-CM

## 2016-05-19 NOTE — Progress Notes (Signed)
   PRENATAL VISIT NOTE  Subjective:  Sheryl Buck is a 31 y.o. [id number] at 2815w3d being seen today for ongoing prenatal care.  She is currently monitored for the following issues for this low-risk pregnancy and has Supervision of other normal pregnancy, antepartum and Headache in pregnancy, antepartum on her problem list.  Patient reports occasional contractions.  Contractions: Irregular. Vag. Bleeding: None.  Movement: Present. Denies leaking of fluid.   The following portions of the patient's history were reviewed and updated as appropriate: allergies, current medications, past family history, past medical history, past social history, past surgical history and problem list. Problem list updated.  Objective:   Vitals:   05/19/16 1004  BP: 110/73  Pulse: (!) 105  Weight: 157 lb (71.2 kg)    Fetal Status: Fetal Heart Rate (bpm): 157 Fundal Height: 35 cm Movement: Present  Presentation: Vertex  General:  Alert, oriented and cooperative. Patient is in no acute distress.  Skin: Skin is warm and dry. No rash noted.   Cardiovascular: Normal heart rate noted  Respiratory: Normal respiratory effort, no problems with respiration noted  Abdomen: Soft, gravid, appropriate for gestational age. Pain/Pressure: Present     Pelvic:  Cervical exam performed Dilation: Fingertip Effacement (%): 0 Station: Ballotable  Extremities: Normal range of motion.  Edema: Trace  Mental Status: Normal mood and affect. Normal behavior. Normal judgment and thought content.   Assessment and Plan:  Pregnancy: [id number] at 6615w3d  There are no diagnoses linked to this encounter. Preterm labor symptoms and general obstetric precautions including but not limited to vaginal bleeding, contractions, leaking of fluid and fetal movement were reviewed in detail with the patient. Please refer to After Visit Summary for other counseling recommendations.  Return in about 11 days (around 05/30/2016).   Sheryl KinsmanVirginia Amily Depp, CNM

## 2016-05-19 NOTE — Patient Instructions (Signed)
Braxton Hicks Contractions °Contractions of the uterus can occur throughout pregnancy. Contractions are not always a sign that you are in labor.  °WHAT ARE BRAXTON HICKS CONTRACTIONS?  °Contractions that occur before labor are called Braxton Hicks contractions, or false labor. Toward the end of pregnancy (32-34 weeks), these contractions can develop more often and may become more forceful. This is not true labor because these contractions do not result in opening (dilatation) and thinning of the cervix. They are sometimes difficult to tell apart from true labor because these contractions can be forceful and people have different pain tolerances. You should not feel embarrassed if you go to the hospital with false labor. Sometimes, the only way to tell if you are in true labor is for your health care provider to look for changes in the cervix. °If there are no prenatal problems or other health problems associated with the pregnancy, it is completely safe to be sent home with false labor and await the onset of true labor. °HOW CAN YOU TELL THE DIFFERENCE BETWEEN TRUE AND FALSE LABOR? °False Labor  °· The contractions of false labor are usually shorter and not as hard as those of true labor.   °· The contractions are usually irregular.   °· The contractions are often felt in the front of the lower abdomen and in the groin.   °· The contractions may go away when you walk around or change positions while lying down.   °· The contractions get weaker and are shorter lasting as time goes on.   °· The contractions do not usually become progressively stronger, regular, and closer together as with true labor.   °True Labor  °· Contractions in true labor last 30-70 seconds, become very regular, usually become more intense, and increase in frequency.   °· The contractions do not go away with walking.   °· The discomfort is usually felt in the top of the uterus and spreads to the lower abdomen and low back.   °· True labor can be  determined by your health care provider with an exam. This will show that the cervix is dilating and getting thinner.   °WHAT TO REMEMBER °· Keep up with your usual exercises and follow other instructions given by your health care provider.   °· Take medicines as directed by your health care provider.   °· Keep your regular prenatal appointments.   °· Eat and drink lightly if you think you are going into labor.   °· If Braxton Hicks contractions are making you uncomfortable:   °¨ Change your position from lying down or resting to walking, or from walking to resting.   °¨ Sit and rest in a tub of warm water.   °¨ Drink 2-3 glasses of water. Dehydration may cause these contractions.   °¨ Do slow and deep breathing several times an hour.   °WHEN SHOULD I SEEK IMMEDIATE MEDICAL CARE? °Seek immediate medical care if: °· Your contractions become stronger, more regular, and closer together.   °· You have fluid leaking or gushing from your vagina.   °· You have a fever.   °· You pass blood-tinged mucus.   °· You have vaginal bleeding.   °· You have continuous abdominal pain.   °· You have low back pain that you never had before.   °· You feel your baby's head pushing down and causing pelvic pressure.   °· Your baby is not moving as much as it used to.   °This information is not intended to replace advice given to you by your health care provider. Make sure you discuss any questions you have with your health care   provider. °Document Released: 03/13/2005 Document Revised: 07/05/2015 Document Reviewed: 12/23/2012 °Elsevier Interactive Patient Education © 2017 [employer] ° °

## 2016-06-02 ENCOUNTER — Other Ambulatory Visit (HOSPITAL_COMMUNITY)
Admission: RE | Admit: 2016-06-02 | Discharge: 2016-06-02 | Disposition: A | Discharge status: Home / Self Care | Payer: Medicaid Other | Source: Ambulatory Visit | Attending: Advanced Practice Midwife | Admitting: Advanced Practice Midwife

## 2016-06-02 ENCOUNTER — Ambulatory Visit (INDEPENDENT_AMBULATORY_CARE_PROVIDER_SITE_OTHER): Payer: Medicaid Other | Admitting: Advanced Practice Midwife

## 2016-06-02 VITALS — BP 112/72 | HR 81 | Wt 163.0 lb

## 2016-06-02 DIAGNOSIS — Z3480 Encounter for supervision of other normal pregnancy, unspecified trimester: Secondary | ICD-10-CM

## 2016-06-02 DIAGNOSIS — Z113 Encounter for screening for infections with a predominantly sexual mode of transmission: Secondary | ICD-10-CM | POA: Diagnosis present

## 2016-06-02 DIAGNOSIS — Z3493 Encounter for supervision of normal pregnancy, unspecified, third trimester: Secondary | ICD-10-CM

## 2016-06-02 DIAGNOSIS — Z789 Other specified health status: Secondary | ICD-10-CM

## 2016-06-02 DIAGNOSIS — Z3483 Encounter for supervision of other normal pregnancy, third trimester: Secondary | ICD-10-CM

## 2016-06-02 LAB — OB RESULTS CONSOLE GBS: GBS: NEGATIVE

## 2016-06-02 MED ORDER — BREAST PUMP MISC: 0 refills | Status: DC

## 2016-06-02 NOTE — Patient Instructions (Signed)
Breastfeeding Challenges and Solutions  Even though breastfeeding is natural, it can be challenging, especially in the first few weeks after childbirth. It is normal for problems to arise when starting to breastfeed your new baby, even if you have breastfed before. This document provides some solutions to the most common breastfeeding challenges.  Challenges and solutions  Challenge--Cracked or Sore Nipples  Cracked or sore nipples are commonly experienced by breastfeeding mothers. Cracked or sore nipples often are caused by inadequate latching (when your baby's mouth attaches to your breast to breastfeed). Soreness can also happen if your baby is not positioned properly at your breast. Although nipple cracking and soreness are common during the first week after birth, nipple pain is never normal. If you experience nipple cracking or soreness that lasts longer than 1 week or nipple pain, call your health care provider or lactation consultant.  Solution  Ensure proper latching and positioning of your baby by following the steps below:  · Find a comfortable place to sit or lie down, with your neck and back well supported.  · Place a pillow or rolled up blanket under your baby to bring him or her to the level of your breast (if you are seated).  · Make sure that your baby's abdomen is facing your abdomen.  · Gently massage your breast. With your fingertips, massage from your chest wall toward your nipple in a circular motion. This encourages milk flow. You may need to continue this action during the feeding if your milk flows slowly.  · Support your breast with 4 fingers underneath and your thumb above your nipple. Make sure your fingers are well away from your nipple and your baby’s mouth.  · Stroke your baby's lips gently with your finger or nipple.  · When your baby's mouth is open wide enough, quickly bring your baby to your breast, placing your entire nipple and as much of the colored area around your nipple  (areola) as possible into your baby's mouth.  ? More areola should be visible above your baby's upper lip than below the lower lip.  ? Your baby's tongue should be between his or her lower gum and your breast.  · Ensure that your baby's mouth is correctly positioned around your nipple (latched). Your baby's lips should create a seal on your breast and be turned out (everted).  · It is common for your baby to suck for about 2-3 minutes in order to start the flow of breast milk.    Signs that your baby has successfully latched on to your nipple include:  · Quietly tugging or quietly sucking without causing you pain.  · Swallowing heard between every 3-4 sucks.  · Muscle movement above and in front of his or her ears with sucking.    Signs that your baby has not successfully latched on to nipple include:  · Sucking sounds or smacking sounds from your baby while nursing.  · Nipple pain.    Ensure that your breasts stay moisturized and healthy by:  · Avoiding the use of soap on your nipples.  · Wearing a supportive bra. Avoid wearing underwire-style bras or tight bras.  · Air drying your nipples for 3-4 minutes after each feeding.  · Using only cotton bra pads to absorb breast milk leakage. Leaking of breast milk between feedings is normal. Be sure to change the pads if they become soaked with milk.  · Using lanolin on your nipples after nursing. Lanolin helps to maintain your   skin's normal moisture barrier. If you use pure lanolin you do not need to wash it off before feeding your baby again. Pure lanolin is not toxic to your baby. You may also hand express a few drops of breast milk and gently massage that milk into your nipples, allowing it to air dry.    Challenge--Breast Engorgement  Breast engorgement is the overfilling of your breasts with breast milk. In the first few weeks after giving birth, you may experience breast engorgement. Breast engorgement can make your breasts throb and feel hard, tightly stretched,  warm, and tender. Engorgement peaks about the fifth day after you give birth. Having breast engorgement does not mean you have to stop breastfeeding your baby.  Solution  · Breastfeed when you feel the need to reduce the fullness of your breasts or when your baby shows signs of hunger. This is called "breastfeeding on demand."  · Newborns (babies younger than 4 weeks) often breastfeed every 1-3 hours during the day. You may need to awaken your baby to feed if he or she is asleep at a feeding time.  · Do not allow your baby to sleep longer than 5 hours during the night without a feeding.  · Pump or hand express breast milk before breastfeeding to soften your breast, areola, and nipple.  · Apply warm, moist heat (in the shower or with warm water-soaked hand towels) just before feeding or pumping, or massage your breast before or during breastfeeding. This increases circulation and helps your milk to flow.  · Completely empty your breasts when breastfeeding or pumping. Afterward, wear a snug bra (nursing or regular) or tank top for 1-2 days to signal your body to slightly decrease milk production. Only wear snug bras or tank tops to treat engorgement. Tight bras typically should be avoided by breastfeeding mothers. Once engorgement is relieved, return to wearing regular, loose-fitting clothes.  · Apply ice packs to your breasts to lessen the pain from engorgement and relieve swelling, unless the ice is uncomfortable for you.  · Do not delay feedings. Try to relax when it is time to feed your baby. This helps to trigger your "let-down reflex," which releases milk from your breast.  · Ensure your baby is latched on to your breast and positioned properly while breastfeeding.  · Allow your baby to remain at your breast as long as he or she is latched on well and actively sucking. Your baby will let you know when he or she is done breastfeeding by pulling away from your breast or falling asleep.  · Avoid introducing bottles  or pacifiers to your baby in the early weeks of breastfeeding. Wait to introduce these things until after resolving any breastfeeding challenges.  · Try to pump your milk on the same schedule as when your baby would breastfeed if you are returning to work or away from home for an extended period.  · Drink plenty of fluids to avoid dehydration, which can eventually put you at greater risk of breast engorgement.    If you follow these suggestions, your engorgement should improve in 24-48 hours. If you are still experiencing difficulty, call your lactation consultant or health care provider.  Challenge--Plugged Milk Ducts  Plugged milk ducts occur when the duct does not drain milk effectively and becomes swollen. Wearing a tight-fitting nursing bra or having difficulty with latching may cause plugged milk ducts. Not drinking enough water (8-10 c [1.9-2.4 L] per day) can contribute to plugged milk ducts. Once a   duct has become plugged, hard lumps, soreness, and redness may develop in your breast.  Solution  Do not delay feedings. Feed your baby frequently and try to empty your breasts of milk at each feeding. Try breastfeeding from the affected side first so there is a better chance that the milk will drain completely from that breast. Apply warm, moist towels to your breasts for 5-10 minutes before feeding. Alternatively, a hot shower right before breastfeeding can provide the moist heat that can encourage milk flow. Gentle massage of the sore area before and during a feeding may also help. Avoid wearing tight clothing or bras that put pressure on your breasts. Wear bras that offer good support to your breasts, but avoid underwire bras. If you have a plugged milk duct and develop a fever, you need to see your health care provider.  Challenge--Mastitis  Mastitis is inflammation of your breast. It usually is caused by a bacterial infection and can cause flu-like symptoms. You may develop redness in your breast and a  fever. Often when mastitis occurs, your breast becomes firm, warm, and very painful. The most common causes of mastitis are poor latching, ineffective sucking from your baby, consistent pressure on your breast (possibly from wearing a tight-fitting bra or shirt that restricts the milk flow), unusual stress or fatigue, or missed feedings.  Solution  You will be given antibiotic medicine to treat the infection. It is still important to breastfeed frequently to empty your breasts. Continuing to breastfeed while you recover from mastitis will not harm your baby. Make sure your baby is positioned properly during every feeding. Apply moist heat to your breasts for a few minutes before feeding to help the milk flow and to help your breasts empty more easily.  Challenge--Thrush  Thrush is a yeast infection that can form on your nipples, in your breast, or in your baby's mouth. It causes itching, soreness, burning or stabbing pain, and sometimes a rash.  Solution  You will be given a medicated ointment for your nipples, and your baby will be given a liquid medicine for his or her mouth. It is important that you and your baby are treated at the same time because thrush can be passed between you and your baby. Change disposable nursing pads often. Any bras, towels, or clothing that come in contact with infected areas of your body or your baby's body need to be washed in very hot water every day. Wash your hands and your baby's hands often. All pacifiers, bottle nipples, or toys your baby puts in his or her mouth should be boiled once a day for 20 minutes. After 1 week of treatment, discard pacifiers and bottle nipples and buy new ones. All breast pump parts that touch the milk need to be boiled for 20 minutes every day.  Challenge--Low Milk Supply  You may not be producing enough milk if your baby is not gaining the proper amount of weight. Breast milk production is based on a supply-and-demand system. Your milk supply depends  on how frequently and effectively your baby empties your breast.  Solution  The more you breastfeed and pump, the more breast milk you will produce. It is important that your baby empties at least one of your breasts at each feeding. If this is not happening, then use a breast pump or hand express any milk that remains. This will help to drain as much milk as possible at each feeding. It will also signal your body to produce more   milk. If your baby is not emptying your breasts, it may be due to latching, sucking, or positioning problems. If low milk supply continues after addressing these issues, contact your health care provider or a lactation specialist as soon as possible.  Challenge--Inverted or Flat Nipples  Some women have nipples that turn inward instead of protruding outward. Other women have nipples that are flat. Inverted or flat nipples can sometimes make it more difficult for your baby to latch onto your breast.  Solution  You may be given a small device that pulls out inverted nipples. This device should be applied right before your baby is brought to your breast. You can also try using a breast pump for a short time before placing the baby at your breast. The pump can pull your nipple outwards to help your infant latch more easily. The baby's sucking motion will help the inverted nipple protrude as well.  If you have flat nipples, encourage your baby to latch onto your breast and feed frequently in the early days after birth. This will give your baby practice latching on correctly while your breast is still soft. When your milk supply increases, between the second and fifth day after birth and your breasts become full, your baby will have an easier time latching.  Contact a lactation consultant if you still have concerns. She or he can teach you additional techniques to address breastfeeding problems related to nipple shape and position.  Where to find more information:  [employer]:  www.llli.org  This information is not intended to replace advice given to you by your health care provider. Make sure you discuss any questions you have with your health care provider.  Document Released: 09/04/2005 Document Revised: 08/25/2015 Document Reviewed: 09/06/2012  Elsevier Interactive Patient Education © 2017 [employer]

## 2016-06-02 NOTE — Progress Notes (Signed)
   PRENATAL VISIT NOTE  Subjective:  Sheryl Buck is a 31 y.o. [id number] at 2517w3d being seen today for ongoing prenatal care.  She is currently monitored for the following issues for this low-risk pregnancy and has Supervision of other normal pregnancy, antepartum and Headache in pregnancy, antepartum on her problem list.  Patient reports occasional contractions.  Contractions: Irregular. Vag. Bleeding: None.  Movement: Present. Denies leaking of fluid.   The following portions of the patient's history were reviewed and updated as appropriate: allergies, current medications, past family history, past medical history, past social history, past surgical history and problem list. Problem list updated.  Objective:   Vitals:   06/02/16 1033  BP: 112/72  Pulse: 81  Weight: 163 lb (73.9 kg)    Fetal Status: Fetal Heart Rate (bpm): 148 Fundal Height: 36 cm Movement: Present  Presentation: Vertex  General:  Alert, oriented and cooperative. Patient is in no acute distress.  Skin: Skin is warm and dry. No rash noted.   Cardiovascular: Normal heart rate noted  Respiratory: Normal respiratory effort, no problems with respiration noted  Abdomen: Soft, gravid, appropriate for gestational age. Pain/Pressure: Present     Pelvic:  Cervical exam performed Dilation: Fingertip Effacement (%): 0 Station: -3  Extremities: Normal range of motion.  Edema: Trace  Mental Status: Normal mood and affect. Normal behavior. Normal judgment and thought content.   Assessment and Plan:  Pregnancy: [id number] at 5117w3d  1. Normal pregnancy in third trimester  - Culture, beta strep (group b only) - Urine cytology ancillary only - Misc. Devices (BREAST PUMP) MISC; Dispense one breast pump for patient  Dispense: 1 each; Refill: 0  2. Exclusively breastfeed infant  - Misc. Devices (BREAST PUMP) MISC; Dispense one breast pump for patient  Dispense: 1 each; Refill: 0  3. Supervision of other normal pregnancy,  antepartum   Term labor symptoms and general obstetric precautions including but not limited to vaginal bleeding, contractions, leaking of fluid and fetal movement were reviewed in detail with the patient. Please refer to After Visit Summary for other counseling recommendations.  Return in about 1 week (around 06/09/2016) for ROB.   Sheryl KinsmanVirginia Jaydyn Buck, CNM

## 2016-06-04 LAB — CULTURE, BETA STREP (GROUP B ONLY)

## 2016-06-05 LAB — URINE CYTOLOGY ANCILLARY ONLY
CHLAMYDIA, DNA PROBE: NEGATIVE
NEISSERIA GONORRHEA: NEGATIVE

## 2016-06-09 ENCOUNTER — Ambulatory Visit (INDEPENDENT_AMBULATORY_CARE_PROVIDER_SITE_OTHER): Payer: Medicaid Other | Admitting: Family

## 2016-06-09 VITALS — BP 106/68 | HR 86

## 2016-06-09 DIAGNOSIS — Z3480 Encounter for supervision of other normal pregnancy, unspecified trimester: Secondary | ICD-10-CM

## 2016-06-09 DIAGNOSIS — Z3483 Encounter for supervision of other normal pregnancy, third trimester: Secondary | ICD-10-CM

## 2016-06-09 LAB — COMPREHENSIVE METABOLIC PANEL
ALBUMIN: 3 g/dL — AB (ref 3.6–5.1)
ALT: 15 U/L (ref 6–29)
AST: 19 U/L (ref 10–30)
Alkaline Phosphatase: 226 U/L — ABNORMAL HIGH (ref 33–115)
BILIRUBIN TOTAL: 0.3 mg/dL (ref 0.2–1.2)
BUN: 6 mg/dL — AB (ref 7–25)
CHLORIDE: 107 mmol/L (ref 98–110)
CO2: 22 mmol/L (ref 20–31)
CREATININE: 0.5 mg/dL (ref 0.50–1.10)
Calcium: 8.5 mg/dL — ABNORMAL LOW (ref 8.6–10.2)
Glucose, Bld: 69 mg/dL (ref 65–99)
Potassium: 4.1 mmol/L (ref 3.5–5.3)
SODIUM: 138 mmol/L (ref 135–146)
Total Protein: 5.5 g/dL — ABNORMAL LOW (ref 6.1–8.1)

## 2016-06-09 LAB — CBC
HCT: 35 % (ref 35.0–45.0)
HEMOGLOBIN: 11.7 g/dL (ref 11.7–15.5)
MCH: 27.7 pg (ref 27.0–33.0)
MCHC: 33.4 g/dL (ref 32.0–36.0)
MCV: 82.9 fL (ref 80.0–100.0)
MPV: 9.8 fL (ref 7.5–12.5)
PLATELETS: 175 10*3/uL (ref 140–400)
RBC: 4.22 MIL/uL (ref 3.80–5.10)
RDW: 13.7 % (ref 11.0–15.0)
WBC: 7.5 10*3/uL (ref 3.8–10.8)

## 2016-06-09 NOTE — Progress Notes (Signed)
   PRENATAL VISIT NOTE  Subjective:  Sheryl Buck is a 31 y.o. [id number] at 49w3dbeing seen today for ongoing prenatal care.  She is currently monitored for the following issues for this low-risk pregnancy and has Supervision of other normal pregnancy, antepartum and Headache in pregnancy, antepartum on her problem list.  Patient reports concern due to finding mice in vents in apartment.  Denies flu-like symptoms.  Contractions: Irregular. Vag. Bleeding: None.  Movement: Present. Denies leaking of fluid.   The following portions of the patient's history were reviewed and updated as appropriate: allergies, current medications, past family history, past medical history, past social history, past surgical history and problem list. Problem list updated.  Objective:   Vitals:   06/09/16 0919  BP: 106/68  Pulse: 86    Fetal Status: Fetal Heart Rate (bpm): 132 Fundal Height: 37 cm Movement: Present     General:  Alert, oriented and cooperative. Patient is in no acute distress.  Skin: Skin is warm and dry. No rash noted.   Cardiovascular: Normal heart rate noted  Respiratory: Normal respiratory effort, no problems with respiration noted  Abdomen: Soft, gravid, appropriate for gestational age. Pain/Pressure: Present     Pelvic:  Cervical exam performed      1/thick  Extremities: Normal range of motion.  Edema: Trace  Mental Status: Normal mood and affect. Normal behavior. Normal judgment and thought content.   Assessment and Plan:  Pregnancy: [id number] 379w3d1. 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 Please refer to After Visit Summary for other counseling recommendations.  Return in about 1 week (around 06/16/2016).   WaVenia Carbon Michiel CowboyCNM

## 2016-06-13 ENCOUNTER — Encounter (HOSPITAL_COMMUNITY): Payer: Self-pay | Admitting: *Deleted

## 2016-06-13 ENCOUNTER — Inpatient Hospital Stay (HOSPITAL_COMMUNITY)
Admission: AD | Admit: 2016-06-13 | Discharge: 2016-06-14 | Disposition: A | Discharge status: Home / Self Care | Payer: Medicaid Other | Source: Ambulatory Visit | Attending: Family Medicine | Admitting: Family Medicine

## 2016-06-13 DIAGNOSIS — Z3A39 39 weeks gestation of pregnancy: Secondary | ICD-10-CM | POA: Insufficient documentation

## 2016-06-13 DIAGNOSIS — O479 False labor, unspecified: Secondary | ICD-10-CM

## 2016-06-13 DIAGNOSIS — Z3480 Encounter for supervision of other normal pregnancy, unspecified trimester: Secondary | ICD-10-CM

## 2016-06-13 DIAGNOSIS — R51 Headache: Secondary | ICD-10-CM

## 2016-06-13 DIAGNOSIS — O26899 Other specified pregnancy related conditions, unspecified trimester: Secondary | ICD-10-CM

## 2016-06-13 NOTE — MAU Note (Signed)
I have communicated with Dr. Genevie AnnSchenk and reviewed vital signs:  Vitals:   06/13/16 2148  BP: 113/73  Resp: 20  Temp: 98 F (36.7 C)    Vaginal exam:  Dilation: 2 Effacement (%): 50 Cervical Position: Middle Station: Ballotable Presentation: Vertex Exam by:: DCALLAWAY, RN  ,   Also reviewed contraction pattern and that non-stress test is reactive.  It has been documented that patient is contracting every 2-5 minutes with minimal cervical change over 1.5 hours not indicating active labor.  Patient denies any other complaints.  Based on this report provider has given order for discharge.  A discharge order and diagnosis entered by a provider.   Labor discharge instructions reviewed with patient.

## 2016-06-13 NOTE — MAU Note (Signed)
PT  SAYS SHE  FEELS  [facility]  -   AT 6PM- STRONG   . [facility]- [place]  -   VE  ON Friday  1   CM .  DENIES HSV AND MRSA.  GBS-   NEG

## 2016-06-13 NOTE — Discharge Instructions (Signed)

## 2016-06-14 DIAGNOSIS — O479 False labor, unspecified: Secondary | ICD-10-CM | POA: Diagnosis present

## 2016-06-14 DIAGNOSIS — Z3A39 39 weeks gestation of pregnancy: Secondary | ICD-10-CM | POA: Diagnosis not present

## 2016-06-15 ENCOUNTER — Ambulatory Visit (INDEPENDENT_AMBULATORY_CARE_PROVIDER_SITE_OTHER): Payer: Medicaid Other | Admitting: Obstetrics & Gynecology

## 2016-06-15 DIAGNOSIS — Z3483 Encounter for supervision of other normal pregnancy, third trimester: Secondary | ICD-10-CM

## 2016-06-15 DIAGNOSIS — Z3480 Encounter for supervision of other normal pregnancy, unspecified trimester: Secondary | ICD-10-CM

## 2016-06-15 NOTE — Progress Notes (Signed)
   PRENATAL VISIT NOTE  Subjective:  Dorathy DaftKarha Belizaire is a 31 y.o. [id number] at 2946w2d being seen today for ongoing prenatal care.  She is currently monitored for the following issues for this low-risk pregnancy and has Supervision of other normal pregnancy, antepartum and Headache in pregnancy, antepartum on her problem list.  Patient reports no complaints.  Contractions: Irregular. Vag. Bleeding: None.  Movement: Present. Denies leaking of fluid.   The following portions of the patient's history were reviewed and updated as appropriate: allergies, current medications, past family history, past medical history, past social history, past surgical history and problem list. Problem list updated.  Objective:   Vitals:   06/15/16 1452  BP: 116/79  Pulse: (!) 105  Weight: 166 lb (75.3 kg)    Fetal Status: Fetal Heart Rate (bpm): 126   Movement: Present     General:  Alert, oriented and cooperative. Patient is in no acute distress.  Skin: Skin is warm and dry. No rash noted.   Cardiovascular: Normal heart rate noted  Respiratory: Normal respiratory effort, no problems with respiration noted  Abdomen: Soft, gravid, appropriate for gestational age. Pain/Pressure: Present     Pelvic:  Cervical exam performed        Extremities: Normal range of motion.  Edema: Trace  Mental Status: Normal mood and affect. Normal behavior. Normal judgment and thought content.   Assessment and Plan:  Pregnancy: [name] at 6946w2d  1. Supervision of other normal pregnancy, antepartum No issues other than tired of being pregnant.  Term labor symptoms and general obstetric precautions including but not limited to vaginal bleeding, contractions, leaking of fluid and fetal movement were reviewed in detail with the patient. Please refer to After Visit Summary for other counseling recommendations.  Return in about 1 week (around 06/22/2016).   Lesly DukesKelly H Rashonda Warrior, MD

## 2016-06-16 ENCOUNTER — Encounter: Payer: Medicaid Other | Admitting: Advanced Practice Midwife

## 2016-06-17 ENCOUNTER — Inpatient Hospital Stay (HOSPITAL_COMMUNITY)
Admission: AD | Admit: 2016-06-17 | Discharge: 2016-06-17 | Disposition: A | Discharge status: Home Health | Payer: Medicaid Other | Source: Ambulatory Visit | Attending: Obstetrics & Gynecology | Admitting: Obstetrics & Gynecology

## 2016-06-17 ENCOUNTER — Encounter (HOSPITAL_COMMUNITY): Payer: Self-pay

## 2016-06-17 DIAGNOSIS — O26893 Other specified pregnancy related conditions, third trimester: Secondary | ICD-10-CM | POA: Diagnosis not present

## 2016-06-17 DIAGNOSIS — Z3A38 38 weeks gestation of pregnancy: Secondary | ICD-10-CM | POA: Diagnosis not present

## 2016-06-17 DIAGNOSIS — N898 Other specified noninflammatory disorders of vagina: Secondary | ICD-10-CM | POA: Diagnosis present

## 2016-06-17 DIAGNOSIS — R109 Unspecified abdominal pain: Secondary | ICD-10-CM

## 2016-06-17 LAB — WET PREP, GENITAL
CLUE CELLS WET PREP: NONE SEEN
Sperm: NONE SEEN
TRICH WET PREP: NONE SEEN
YEAST WET PREP: NONE SEEN

## 2016-06-17 LAB — OB RESULTS CONSOLE GC/CHLAMYDIA: GC PROBE AMP, GENITAL: NEGATIVE

## 2016-06-17 NOTE — Discharge Instructions (Signed)

## 2016-06-17 NOTE — MAU Provider Note (Signed)
History   [id number] in with c/o pinkish discharge since Tuesday. And mild contractions.  CSN: [id number]  Arrival date & time 06/17/16  1121   None     Chief Complaint  Patient presents with  . Vaginal Discharge    HPI  Past Medical History:  Diagnosis Date  . Medical history non-contributory     Past Surgical History:  Procedure Laterality Date  . TONSILLECTOMY      Family History  Problem Relation Age of Onset  . Cancer Mother     cervical  . Cancer Father     neck  . Spina bifida Sister     Social History  Substance Use Topics  . Smoking status: Never Smoker  . Smokeless tobacco: Never Used  . Alcohol use No    OB History    Gravida Para Term Preterm AB Living   5 2 2   2 2    SAB TAB Ectopic Multiple Live Births   2              Review of Systems  Constitutional: Negative.   HENT: Negative.   Eyes: Negative.   Respiratory: Negative.   Cardiovascular: Negative.   Gastrointestinal: Positive for abdominal pain.  Endocrine: Negative.   Genitourinary: Positive for vaginal discharge.  Musculoskeletal: Negative.   Allergic/Immunologic: Negative.   Neurological: Negative.   Hematological: Negative.   Psychiatric/Behavioral: Negative.     Allergies  Patient has no known allergies.  Home Medications    BP 102/68   Pulse (!) 106   Temp 98.4 F (36.9 C) (Oral)   Resp 18   LMP 09/24/2015   Physical Exam  Constitutional: She is oriented to person, place, and time. She appears well-developed and well-nourished.  HENT:  Head: Normocephalic.  Eyes: Pupils are equal, round, and reactive to light.  Neck: Normal range of motion.  Cardiovascular: Normal rate, regular rhythm, normal heart sounds and intact distal pulses.   Pulmonary/Chest: Effort normal and breath sounds normal.  Abdominal: Soft. Bowel sounds are normal.  Genitourinary: Vagina normal and uterus normal.  Musculoskeletal: Normal range of motion.  Neurological: She is alert and  oriented to person, place, and time. She has normal reflexes.  Skin: Skin is warm and dry.  Psychiatric: She has a normal mood and affect. Her behavior is normal. Judgment and thought content normal.    MAU Course  Procedures (including critical care time)  Labs Reviewed  WET PREP, GENITAL  GC[facility]AMYDIA PROBE AMP [facility]   No results found.   1. Vaginal discharge during pregnancy in third trimester       MDM  Wet prep neg, gc and chla done. Fern neg, VSS, FHR pattern reassuring. SVE ft/th/post/high. Will d/c home

## 2016-06-17 NOTE — MAU Note (Signed)
Patient presents with a lot of pink mucus discharge since Tuesday, has increased, having contractions every 3 to 5 minutes.

## 2016-06-19 LAB — GC/CHLAMYDIA PROBE AMP (~~LOC~~) NOT AT ARMC
CHLAMYDIA, DNA PROBE: NEGATIVE
NEISSERIA GONORRHEA: NEGATIVE

## 2016-06-21 ENCOUNTER — Encounter (HOSPITAL_COMMUNITY): Payer: Self-pay | Admitting: *Deleted

## 2016-06-21 ENCOUNTER — Inpatient Hospital Stay (HOSPITAL_COMMUNITY)
Admission: AD | Admit: 2016-06-21 | Discharge: 2016-06-23 | DRG: 775 | Disposition: A | Discharge status: Home / Self Care | Payer: Medicaid Other | Source: Ambulatory Visit | Attending: Family Medicine | Admitting: Family Medicine

## 2016-06-21 DIAGNOSIS — O26899 Other specified pregnancy related conditions, unspecified trimester: Secondary | ICD-10-CM

## 2016-06-21 DIAGNOSIS — R51 Headache: Secondary | ICD-10-CM

## 2016-06-21 DIAGNOSIS — Z3A39 39 weeks gestation of pregnancy: Secondary | ICD-10-CM | POA: Diagnosis not present

## 2016-06-21 DIAGNOSIS — Z3480 Encounter for supervision of other normal pregnancy, unspecified trimester: Secondary | ICD-10-CM

## 2016-06-21 DIAGNOSIS — Z3493 Encounter for supervision of normal pregnancy, unspecified, third trimester: Secondary | ICD-10-CM | POA: Diagnosis present

## 2016-06-21 LAB — CBC
HCT: 34.7 % — ABNORMAL LOW (ref 36.0–46.0)
Hemoglobin: 11.6 g/dL — ABNORMAL LOW (ref 12.0–15.0)
MCH: 27.6 pg (ref 26.0–34.0)
MCHC: 33.4 g/dL (ref 30.0–36.0)
MCV: 82.4 fL (ref 78.0–100.0)
PLATELETS: 173 10*3/uL (ref 150–400)
RBC: 4.21 MIL/uL (ref 3.87–5.11)
RDW: 14 % (ref 11.5–15.5)
WBC: 16.1 10*3/uL — AB (ref 4.0–10.5)

## 2016-06-21 LAB — TYPE AND SCREEN
ABO/RH(D): B POS
Antibody Screen: NEGATIVE

## 2016-06-21 MED ORDER — MEASLES, MUMPS & RUBELLA VAC ~~LOC~~ INJ
0.5000 mL | INJECTION | Freq: Once | SUBCUTANEOUS | Status: DC
  Filled 2016-06-21: qty 0.5

## 2016-06-21 MED ORDER — PRENATAL MULTIVITAMIN CH
1.0000 | ORAL_TABLET | Freq: Every day | ORAL | Status: DC
  Administered 2016-06-21 – 2016-06-22 (×2): 1 via ORAL
  Filled 2016-06-21 (×4): qty 1

## 2016-06-21 MED ORDER — ACETAMINOPHEN 325 MG PO TABS: 650.0000 mg | ORAL_TABLET | ORAL | Status: DC | PRN

## 2016-06-21 MED ORDER — DIPHENHYDRAMINE HCL 25 MG PO CAPS: 25.0000 mg | ORAL_CAPSULE | Freq: Four times a day (QID) | ORAL | Status: DC | PRN

## 2016-06-21 MED ORDER — SODIUM CHLORIDE 0.9 % IV SOLN: 250.0000 mL | INTRAVENOUS | Status: DC | PRN

## 2016-06-21 MED ORDER — ONDANSETRON HCL 4 MG/2ML IJ SOLN: 4.0000 mg | Freq: Four times a day (QID) | INTRAMUSCULAR | Status: DC | PRN

## 2016-06-21 MED ORDER — FLEET ENEMA 7-19 GM/118ML RE ENEM: 1.0000 | ENEMA | Freq: Every day | RECTAL | Status: DC | PRN

## 2016-06-21 MED ORDER — OXYTOCIN 10 UNIT/ML IJ SOLN
10.0000 [IU] | Freq: Once | INTRAMUSCULAR | Status: AC
  Administered 2016-06-21: 10 [IU] via INTRAMUSCULAR

## 2016-06-21 MED ORDER — BENZOCAINE-MENTHOL 20-0.5 % EX AERO
1.0000 "application " | INHALATION_SPRAY | CUTANEOUS | Status: DC | PRN
  Administered 2016-06-21: 1 via TOPICAL
  Filled 2016-06-21: qty 56

## 2016-06-21 MED ORDER — FENTANYL CITRATE (PF) 100 MCG/2ML IJ SOLN: 50.0000 ug | INTRAMUSCULAR | Status: DC | PRN

## 2016-06-21 MED ORDER — OXYTOCIN BOLUS FROM INFUSION: 500.0000 mL | Freq: Once | INTRAVENOUS | Status: DC

## 2016-06-21 MED ORDER — COCONUT OIL OIL: 1.0000 "application " | TOPICAL_OIL | Status: DC | PRN

## 2016-06-21 MED ORDER — WITCH HAZEL-GLYCERIN EX PADS: 1.0000 "application " | MEDICATED_PAD | CUTANEOUS | Status: DC | PRN

## 2016-06-21 MED ORDER — TERBUTALINE SULFATE 1 MG/ML IJ SOLN
0.2500 mg | Freq: Once | INTRAMUSCULAR | Status: DC | PRN
  Filled 2016-06-21: qty 1

## 2016-06-21 MED ORDER — SODIUM CHLORIDE 0.9% FLUSH: 3.0000 mL | INTRAVENOUS | Status: DC | PRN

## 2016-06-21 MED ORDER — ZOLPIDEM TARTRATE 5 MG PO TABS: 5.0000 mg | ORAL_TABLET | Freq: Every evening | ORAL | Status: DC | PRN

## 2016-06-21 MED ORDER — LACTATED RINGERS IV SOLN: 500.0000 mL | INTRAVENOUS | Status: DC | PRN

## 2016-06-21 MED ORDER — BISACODYL 10 MG RE SUPP: 10.0000 mg | Freq: Every day | RECTAL | Status: DC | PRN

## 2016-06-21 MED ORDER — FLEET ENEMA 7-19 GM/118ML RE ENEM: 1.0000 | ENEMA | RECTAL | Status: DC | PRN

## 2016-06-21 MED ORDER — OXYCODONE-ACETAMINOPHEN 5-325 MG PO TABS: 2.0000 | ORAL_TABLET | ORAL | Status: DC | PRN

## 2016-06-21 MED ORDER — OXYTOCIN 10 UNIT/ML IJ SOLN
INTRAMUSCULAR | Status: AC
  Filled 2016-06-21: qty 1

## 2016-06-21 MED ORDER — LACTATED RINGERS IV SOLN: INTRAVENOUS | Status: DC

## 2016-06-21 MED ORDER — SOD CITRATE-CITRIC ACID 500-334 MG/5ML PO SOLN: 30.0000 mL | ORAL | Status: DC | PRN

## 2016-06-21 MED ORDER — SODIUM CHLORIDE 0.9% FLUSH: 3.0000 mL | Freq: Two times a day (BID) | INTRAVENOUS | Status: DC

## 2016-06-21 MED ORDER — OXYTOCIN 40 UNITS IN LACTATED RINGERS INFUSION - SIMPLE MED: 2.5000 [IU]/h | INTRAVENOUS | Status: DC

## 2016-06-21 MED ORDER — ONDANSETRON HCL 4 MG/2ML IJ SOLN: 4.0000 mg | INTRAMUSCULAR | Status: DC | PRN

## 2016-06-21 MED ORDER — ONDANSETRON HCL 4 MG PO TABS: 4.0000 mg | ORAL_TABLET | ORAL | Status: DC | PRN

## 2016-06-21 MED ORDER — DIBUCAINE 1 % RE OINT: 1.0000 "application " | TOPICAL_OINTMENT | RECTAL | Status: DC | PRN

## 2016-06-21 MED ORDER — LIDOCAINE HCL (PF) 1 % IJ SOLN
30.0000 mL | INTRAMUSCULAR | Status: DC | PRN
  Filled 2016-06-21: qty 30

## 2016-06-21 MED ORDER — OXYCODONE-ACETAMINOPHEN 5-325 MG PO TABS: 1.0000 | ORAL_TABLET | ORAL | Status: DC | PRN

## 2016-06-21 MED ORDER — SIMETHICONE 80 MG PO CHEW: 80.0000 mg | CHEWABLE_TABLET | ORAL | Status: DC | PRN

## 2016-06-21 MED ORDER — SENNOSIDES-DOCUSATE SODIUM 8.6-50 MG PO TABS
2.0000 | ORAL_TABLET | ORAL | Status: DC
  Administered 2016-06-21 – 2016-06-22 (×2): 2 via ORAL
  Filled 2016-06-21 (×2): qty 2

## 2016-06-21 MED ORDER — IBUPROFEN 600 MG PO TABS
600.0000 mg | ORAL_TABLET | Freq: Four times a day (QID) | ORAL | Status: DC
  Administered 2016-06-21 – 2016-06-23 (×8): 600 mg via ORAL
  Filled 2016-06-21 (×10): qty 1

## 2016-06-21 MED ORDER — TETANUS-DIPHTH-ACELL PERTUSSIS 5-2.5-18.5 LF-MCG/0.5 IM SUSP: 0.5000 mL | Freq: Once | INTRAMUSCULAR | Status: DC

## 2016-06-21 NOTE — Progress Notes (Signed)
Around 1750 Patient calls out stating she had a big clot. I went in patient went to bathroom and passed a baseball sized clot. Patient stated she felt fine and was not dizzy lochia was small. Patient was up ambulating in room after passing the clot. I palpated her fundus after passing the clot with deep massage and no other clots or even a trickle came out at all. Vitals stable. I weighed clot and it stated is was 70 gms. Mother told to let RN know if she passes more. Patient seems stable at this point will monitor.

## 2016-06-21 NOTE — MAU Note (Signed)
PT  SAYS HURT WITH [facility]  SINCE  0200.    VE ON Friday-

## 2016-06-21 NOTE — H&P (Signed)
LABOR AND DELIVERY ADMISSION HISTORY AND PHYSICAL NOTE  Sheryl Buck is a 31 y.o. female [phone] with IUP at 7622w1d by 8 wk US presenting for SOL. Pt noted onset of contractions since yesterday with increased contraction frequency every 3 mins around 0200 this morning. She reports positive fetal movement. She denies leakage of fluid or vaginal bleeding. [facility]. Previous 2 pregnancies NSVD w/o complications. Pt wishes to have water birth requesting no pain meds or IV. Agreeable to lab draws. Risks were explained at length to pt and family including need for possible emergent line placement during labor or postpartum for fluids and medications. Pt insisted in no IV. Will therefore proceed with vaginal delivery as she is progressing well on her own.  Prenatal History/Complications: Patient Active Problem List   Diagnosis Date Noted  . Normal labor 06/21/2016  . Headache in pregnancy, antepartum 01/11/2016  . Supervision of other normal pregnancy, antepartum 11/16/2015   Past Medical History: Past Medical History:  Diagnosis Date  . Medical history non-contributory     Past Surgical History: Past Surgical History:  Procedure Laterality Date  . TONSILLECTOMY      Obstetrical History: OB History    Gravida Para Term Preterm AB Living   5 2 2   2 2    SAB TAB Ectopic Multiple Live Births   2              Social History: Social History   Social History  . Marital status: Married    Spouse name: N/A  . Number of children: N/A  . Years of education: N/A   Occupational History  . student    Social History Main Topics  . Smoking status: Never Smoker  . Smokeless tobacco: Never Used  . Alcohol use No  . Drug use: No  . Sexual activity: Yes    Partners: Male   Other Topics Concern  . None   Social History Narrative  . None    Family History: Family History  Problem Relation Age of Onset  . Cancer Mother     cervical  . Cancer  Father     neck  . Spina bifida Sister     Allergies: No Known Allergies  Prescriptions Prior to Admission  Medication Sig Dispense Refill Last Dose  . Prenatal Vit-Fe Fumarate-FA (PRENATAL MULTIVITAMIN) TABS tablet Take 1 tablet by mouth daily.    06/17/2016 at Unknown time     Review of Systems   All systems reviewed and negative except as stated in HPI  Blood pressure (!) 107/59, pulse 80, temperature 97.6 F (36.4 C), temperature source Oral, resp. rate 20, last menstrual period 09/24/2015. General appearance: alert, cooperative and mild distress Lungs: clear to auscultation bilaterally Heart: regular rate and rhythm Abdomen: soft, non-tender; bowel sounds normal Extremities: No calf swelling or tenderness Presentation: cephalic Fetal monitoring: baseline 135, moderate variability, accelerations present, no decelerations Uterine activity: regular q3 mins Dilation: 5 Effacement (%): 80 Station: -1 Exam by:: DCALLAWAY, RN   Prenatal labs: ABO, Rh: B/POS/-- (08/22 1506) Antibody: NEG (08/22 1506) Rubella: Immune RPR: NON REAC (01/12 0829)  HBsAg: NEGATIVE (08/22 1506)  HIV: NONREACTIVE (01/12 0829)  GBS: Negative (03/09 0000)  1 hr Glucola: Normal (78/131/93) Genetic screening:  Normal Anatomy US: Normal  Prenatal Transfer Tool  Maternal Diabetes: No Genetic Screening: Normal Maternal Ultrasounds/Referrals: Normal Fetal Ultrasounds or other Referrals:  None Maternal Substance Abuse:  No Significant Maternal Medications:  None Significant Maternal Lab Results:  Lab values include: Group B Strep negative  No results found for this or any previous visit (from the past 24 hour(s)).  Patient Active Problem List   Diagnosis Date Noted  . Normal labor 06/21/2016  . Headache in pregnancy, antepartum 01/11/2016  . Supervision of other normal pregnancy, antepartum 11/16/2015    Assessment: Sheryl Buck is a 31 y.o. [id number] at [redacted]w[redacted]d here for  SOL.  #Labor:SOL #Pain: No pain managment #FWB: Category I #ID:  GBS negative #MOF: Breast #MOC:None #Circ:  Declined  Wendee Beavers 06/21/2016, 4:25 AM    I spoke with and examined patient and agree with resident/PA/SNM's note and plan of care.  Spontaneous labor, intact membranes, Cat 1 FHR, planning waterbirth, attended class 04/19/16. Consent for waterbirth/study signed today. States she had signed previously in office, unable to locate under media tab.  Cheral Marker, CNM, WHNP-BC 06/21/2016 5:10 AM

## 2016-06-22 ENCOUNTER — Encounter: Payer: Medicaid Other | Admitting: Obstetrics & Gynecology

## 2016-06-22 ENCOUNTER — Encounter (HOSPITAL_COMMUNITY): Payer: Self-pay | Admitting: *Deleted

## 2016-06-22 LAB — RPR: RPR: NONREACTIVE

## 2016-06-22 NOTE — Lactation Note (Signed)
This note was copied from a baby's chart. Lactation Consultation Note Mom's 3rd baby but didn't BF her other 2 children.  Mom has inverted nipples. Hand expressed colostrum. Attempted to latch baby to breast. Baby had wide open flange, suckled about 5 mintues, nipples didn't pull out. Can roll nipple in finger tips for stimulation, will not stay after released. Fitted mom #20 NS. Baby latched well. BF well after about 5 minutes.  Gave mom shells to wear in bra. Mom shown how to use DEBP & how to disassemble, clean, & reassemble parts.Mom knows to pump q3h for 15-20 min. Mom encouraged to feed baby 8-12 times/24 hours and with feeding cues. Educated newborn feeding and behavior habits. Encouraged I&O, STS, supply and demand. WH/LC brochure given w/resources, support groups and LC services. Patient Name: Sheryl Dorathy DaftKarha Senna WUJWJ'XToday's Date: 06/22/2016 Reason for consult: Initial assessment   Maternal Data Has patient been taught Hand Expression?: Yes Does the patient have breastfeeding experience prior to this delivery?: No  Feeding Feeding Type: Breast Fed Length of feed: 20 min  LATCH Score/Interventions Latch: Repeated attempts needed to sustain latch, nipple held in mouth throughout feeding, stimulation needed to elicit sucking reflex. Intervention(s): Adjust position;Assist with latch;Breast massage;Breast compression  Audible Swallowing: A few with stimulation Intervention(s): Skin to skin;Hand expression;Alternate breast massage  Type of Nipple: Inverted Intervention(s): Shells;Hand pump;Double electric pump  Comfort (Breast/Nipple): Soft / non-tender     Hold (Positioning): Full assist, staff holds infant at breast Intervention(s): Breastfeeding basics reviewed;Support Pillows;Position options;Skin to skin  LATCH Score: 4  Lactation Tools Discussed/Used Tools: Shells;Nipple Dorris CarnesShields;Pump Nipple shield size: 20 Shell Type: Inverted Breast pump type: Double-Electric Breast  Pump Pump Review: Setup, frequency, and cleaning;Milk Storage Initiated by:: Sheryl JeffersonL. Logan Vegh RN IBCLC Date initiated:: 06/22/16   Consult Status Consult Status: Follow-up Date: 06/23/16 Follow-up type: In-patient    Sheryl Buck, Sheryl Buck 06/22/2016, 7:52 AM

## 2016-06-22 NOTE — Progress Notes (Signed)
Post Partum Day #1 Subjective: no complaints, up ad lib, voiding, tolerating PO and normal lochia, baby going home tomorrow due to poor feeding.  Objective: Blood pressure 115/65, pulse 84, temperature 98.4 F (36.9 C), temperature source Oral, resp. rate 18, height 5\' 3"  (1.6 m), weight 75.8 kg (167 lb), last menstrual period 09/24/2015, SpO2 99 %, unknown if currently breastfeeding.  Physical Exam:  General: alert Lochia: appropriate Uterine Fundus: firm and NT at U-1 DVT Evaluation: No evidence of DVT seen on physical exam.   Recent Labs  06/21/16 0945  HGB 11.6*  HCT 34.7*    Assessment/Plan: Plan for discharge tomorrow   LOS: 1 day   Sheryl BossierMyra C Odelia Buck 06/22/2016, 6:58 AM

## 2016-06-22 NOTE — Lactation Note (Signed)
This note was copied from a baby's chart. Lactation Consultation Note  Patient Name: Sheryl Buck ZOXWR'UToday's Date: 06/22/2016 Reason for consult: Follow-up assessment Baby at 37 hr of life. Upon entry baby was cueing in mom's arms. Mom seem apprehensive about latching baby with lactation present but dad encouraged her get help. Mom is wearing shells between feeding. Her nipples appeared erect with this feeding. She has "some soreness" on the L side but the R side "is cracked and hurts really bad". No skin break down or bruising noted at this visit. Baby latched easily to the L breast in football position. Had mom try cross cradle on the R side. Baby latched easily. Mom reported pain when the baby first got on the breast but then reported the pain went away after a minute of sucking. She is not longer using the NS. Discussed baby behavior, feeding frequency, baby belly size, voids, wt loss, breast changes, and nipple care. Mom has been using DEBP after each bf. Parents are aware of lactation services and support group. Mom will latch baby on demand and post pump as needed.     Maternal Data    Feeding Feeding Type: Breast Fed Length of feed: 30 min  LATCH Score/Interventions Latch: Grasps breast easily, tongue down, lips flanged, rhythmical sucking. Intervention(s): Adjust position  Audible Swallowing: Spontaneous and intermittent Intervention(s): Alternate breast massage  Type of Nipple: Everted at rest and after stimulation Intervention(s): Shells  Comfort (Breast/Nipple): Filling, red/small blisters or bruises, mild/mod discomfort  Problem noted: Mild/Moderate discomfort;Cracked, bleeding, blisters, bruises Interventions  (Cracked/bleeding/bruising/blister): Expressed breast milk to nipple Interventions (Mild/moderate discomfort): Post-pump (coconut oil)  Hold (Positioning): Assistance needed to correctly position infant at breast and maintain latch. Intervention(s): Position  options;Support Pillows  LATCH Score: 8  Lactation Tools Discussed/Used Tools: Shells   Consult Status Consult Status: Follow-up Date: 06/23/16 Follow-up type: In-patient    Rulon Eisenmengerlizabeth E Rhilynn Preyer 06/22/2016, 8:32 PM

## 2016-06-23 MED ORDER — IBUPROFEN 600 MG PO TABS: 600.0000 mg | ORAL_TABLET | Freq: Four times a day (QID) | ORAL | 0 refills | Status: AC

## 2016-06-23 NOTE — Lactation Note (Signed)
This note was copied from a baby's chart. Lactation Consultation Note; Mo has baby latched to the breast when I went into room. Reports he is doing much better today. Continues using NS on left breast but reports shells are helping that. Has comfort gels. No questions at present. Reviewed our phone number, OP appointments and BFSG as resources for support after DC. To call prn  Patient Name: Sheryl Buck ZOXWR'U Date: 06/23/2016 Reason for consult: Follow-up assessment   Maternal Data Formula Feeding for Exclusion: No Has patient been taught Hand Expression?: Yes Does the patient have breastfeeding experience prior to this delivery?: No  Feeding Feeding Type: Breast Fed Length of feed: 30 min  LATCH Score/Interventions Latch: Grasps breast easily, tongue down, lips flanged, rhythmical sucking.  Audible Swallowing: A few with stimulation  Type of Nipple: Everted at rest and after stimulation (left inverted)  Comfort (Breast/Nipple): Soft / non-tender     Hold (Positioning): No assistance needed to correctly position infant at breast. Intervention(s): Breastfeeding basics reviewed  LATCH Score: 9  Lactation Tools Discussed/Used     Consult Status Consult Status: Complete    Pamelia Hoit 06/23/2016, 10:56 AM

## 2016-06-23 NOTE — Lactation Note (Signed)
This note was copied from a baby's chart. Lactation Consultation Note New mom states baby is BF so much better. Baby is cluster feeding. Mom's nipples inverted, pulls out well w/NS. Nipples red and painful, request comfort gels. CG given to mom w/instructions. Mom currently BF baby. Baby had been very spitty. That has resolved and baby has been BD well.  Encouraged mom to cont. STS, and I&O.  Encouraged to call for assistance if needed and to make sure baby has a wide. Patient Name: Sheryl Buck ZOXWR'U Date: 06/23/2016 Reason for consult: Follow-up assessment;Breast/nipple pain   Maternal Data    Feeding Feeding Type: Breast Fed Length of feed: 20 min  LATCH Score/Interventions Latch: Grasps breast easily, tongue down, lips flanged, rhythmical sucking.  Audible Swallowing: A few with stimulation Intervention(s): Hand expression;Skin to skin  Type of Nipple: Inverted Intervention(s): Shells Intervention(s): Double electric pump  Comfort (Breast/Nipple): Filling, red/small blisters or bruises, mild/mod discomfort  Problem noted: Mild/Moderate discomfort;Cracked, bleeding, blisters, bruises Interventions  (Cracked/bleeding/bruising/blister): Expressed breast milk to nipple Interventions (Mild/moderate discomfort): Comfort gels;Hand massage;Hand expression  Hold (Positioning): No assistance needed to correctly position infant at breast. Intervention(s): Breastfeeding basics reviewed;Support Pillows;Position options;Skin to skin  LATCH Score: 6  Lactation Tools Discussed/Used Tools: Pump;Comfort gels;Nipple Dorris Carnes;Shells Nipple shield size: 20 Shell Type: Inverted Breast pump type: Double-Electric Breast Pump   Consult Status Consult Status: Follow-up Date: 06/24/16 Follow-up type: In-patient    Charyl Dancer 06/23/2016, 6:23 AM

## 2016-06-23 NOTE — Discharge Summary (Signed)
OB Discharge Summary  Patient Name: Sheryl Buck DOB: 1985-08-15 MRN: [id number]  Date of admission: 06/21/2016 Delivering MD: Shawna Clamp R   Date of discharge: 06/23/2016  Admitting diagnosis: 39.1 WEEKS CTX Intrauterine pregnancy: [redacted]w[redacted]d     Secondary diagnosis:Active Problems:   Normal labor      Discharge diagnosis: Term Pregnancy Delivered                                                                      Augmentation: none  Complications: None  Hospital course:  Onset of Labor With Vaginal Delivery     31 y.o. yo [id number] at [redacted]w[redacted]d was admitted in Active Labor on 06/21/2016. Patient had an uncomplicated labor course as follows:  Membrane Rupture Time/Date: 7:11 AM ,06/21/2016   Intrapartum Procedures: Episiotomy: None [1]                                         Lacerations:  None [1]  Patient had a delivery of a Viable infant. 06/21/2016  Information for the patient's newborn:  Irelynd, Zumstein [[id number]]  Delivery Method: Vaginal, Spontaneous Delivery (Filed from Delivery Summary)    Pateint had an uncomplicated postpartum course.  She is ambulating, tolerating a regular diet, passing flatus, and urinating well. Patient is discharged home in stable condition on 06/23/16.   Physical exam  Vitals:   06/21/16 2231 06/22/16 0547 06/22/16 1830 06/23/16 0544  BP: (!) 103/54 115/65 101/67 (!) 96/59  Pulse: 72 84 88 69  Resp: Temp: 97.9 F (36.6 C) 98.4 F (36.9 C) 98.3 F (36.8 C) 98.3 F (36.8 C)  TempSrc: Oral Oral Oral Oral  SpO2: 99%     Weight:      Height:       General: alert Lochia: appropriate Uterine Fundus: firm Incision: N/A DVT Evaluation: No evidence of DVT seen on physical exam. Labs: Lab Results  Component Value Date   WBC 16.1 (H) 06/21/2016   HGB 11.6 (L) 06/21/2016   HCT 34.7 (L) 06/21/2016   MCV 82.4 06/21/2016   PLT 173 06/21/2016   CMP Latest Ref Rng & Units 06/09/2016  Glucose 65 - 99 mg/dL 69  BUN 7  - 25 mg/dL 6(L)  Creatinine 6.57 - 1.10 mg/dL 8.46  Sodium 962 - 952 mmol/L 138  Potassium 3.5 - 5.3 mmol/L 4.1  Chloride 98 - 110 mmol/L 107  CO2 20 - 31 mmol/L 22  Calcium 8.6 - 10.2 mg/dL 8.4(X)  Total Protein 6.1 - 8.1 g/dL 3.2(G)  Total Bilirubin 0.2 - 1.2 mg/dL 0.3  Alkaline Phos 33 - 115 U/L 226(H)  AST 10 - 30 U/L 19  ALT 6 - 29 U/L 15    Discharge instruction: per After Visit Summary and "Baby and Me Booklet".  After Visit Meds:  Allergies as of 06/23/2016   No Known Allergies     Medication List    TAKE these medications   ibuprofen 600 MG tablet Commonly known as:  ADVIL,MOTRIN Take 1 tablet (600 mg total) by mouth every 6 (six) hours.   prenatal multivitamin Tabs tablet Take  1 tablet by mouth daily.       Diet: routine diet  Activity: Advance as tolerated. Pelvic rest for 6 weeks.   Outpatient follow up:6 weeks Follow up Appt:No future appointments. Follow up visit: No Follow-up on file.  Postpartum contraception: Abstinence  Newborn Data: Live born female  Birth Weight: 7 lb 15.5 oz (3615 g) APGAR: 8, 9  Baby Feeding: Breast Disposition:home with mother   06/23/2016 Allie Bossier, MD

## 2016-06-23 NOTE — Discharge Instructions (Signed)

## 2016-08-02 ENCOUNTER — Ambulatory Visit: Payer: Medicaid Other | Admitting: Obstetrics & Gynecology

## 2016-08-07 ENCOUNTER — Ambulatory Visit (INDEPENDENT_AMBULATORY_CARE_PROVIDER_SITE_OTHER): Payer: Medicaid Other | Admitting: Advanced Practice Midwife

## 2016-08-07 ENCOUNTER — Encounter: Payer: Self-pay | Admitting: Advanced Practice Midwife

## 2016-08-07 VITALS — BP 101/61 | HR 82 | Ht 63.0 in | Wt 131.0 lb

## 2016-08-07 DIAGNOSIS — O9989 Other specified diseases and conditions complicating pregnancy, childbirth and the puerperium: Secondary | ICD-10-CM

## 2016-08-07 DIAGNOSIS — O9089 Other complications of the puerperium, not elsewhere classified: Secondary | ICD-10-CM

## 2016-08-07 DIAGNOSIS — M6208 Separation of muscle (nontraumatic), other site: Secondary | ICD-10-CM

## 2016-08-07 DIAGNOSIS — R52 Pain, unspecified: Secondary | ICD-10-CM

## 2016-08-07 NOTE — Progress Notes (Signed)
Post Partum Exam  Sheryl DaftKarha Buck is a 31 y.o. [phone] female who presents for a postpartum visit. She is 7 weeks postpartum following a spontaneous vaginal delivery. I have fully reviewed the prenatal and intrapartum course. The delivery was at 39 gestational weeks.  Anesthesia: none. Postpartum course has been complicated by midline upper abd soreness. Baby's course has been unremarkable. Baby is feeding by bottle - Similac Advance. Bleeding no bleeding. Bowel function is normal. Bladder function is normal. Patient is not sexually active. Contraception method is none. Postpartum depression screening:neg (score 7).  The following portions of the patient's history were reviewed and updated as appropriate: allergies, current medications, past family history, past medical history, past social history, past surgical history and problem list.  Review of Systems Pertinent items are noted in HPI.  Neg for fever, chills, N/V/D/C, appetite change, vaginal bleeding.  Objective:  unknown if currently breastfeeding.  General:  alert, cooperative and appears stated age   Breasts:  declined  Lungs: clear to auscultation bilaterally  Heart:  regular rate and rhythm, S1, S2 normal, no murmur, click, rub or gallop  Abdomen: soft. mild TTP btw upper abd rectus muscles.  No significant bulge. 1 finger-breadth diastasis rectus. No hernia.    Vulva:  not evaluated. Declined  Rectal Exam: Not performed.        Assessment:    Nml postpartum exam. Pap smear not done at today's visit.  1. Postpartum pain   2. Postpartum care and examination   3. Mild Diastasis of rectus abdominis      Plan:   1. Contraception: rhythm method. Discussed LARC. 2. May resume exercise. 3. Discussed Sx of hernia. Offered General Surgery consult if Sx of hernia are present 4. Follow up in: 1 year or as needed.  5. Pap due 2020.   Katrinka BlazingSmith, [place], CNM 08/07/2016 9:45 AM

## 2017-07-25 IMAGING — US US OB COMP LESS 14 WK
1 series · 15 of 28 positions shown · non-contrast
Comparison: No priors.

ADDENDUM:
This is a correction to the prior dictation, but incorrectly
suggested that there was no yolk sac in the Impression section. The
correct impression is as follows:

Early intrauterine gestational sac, with yolk sac, but no fetal
pole, or cardiac activity yet visualized. Recommend follow-up
quantitative B-HCG levels and follow-up US in 14 days to confirm and
assess viability. This recommendation follows SRU consensus
guidelines: Diagnostic Criteria for Nonviable Pregnancy Early in the
First Trimester. N Engl J Med 2001; [DATE].
CLINICAL DATA: 29-year-old female with intermittent right lower
quadrant pain since 8 p.m.. Bold
EXAM:
OBSTETRIC <14 WK US AND TRANSVAGINAL OB US
TECHNIQUE: Both transabdominal and transvaginal ultrasound examinations were
performed for complete evaluation of the gestation as well as the
maternal uterus, adnexal regions, and pelvic cul-de-sac.
Transvaginal technique was performed to assess early pregnancy.

[Series 1: us ob comp less 14 wk · 15 of 55 slices shown]
[im 1/55]
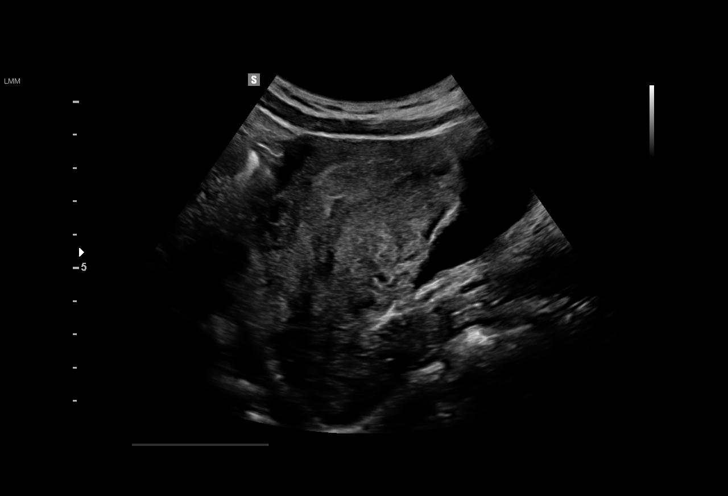
[im 5/55]
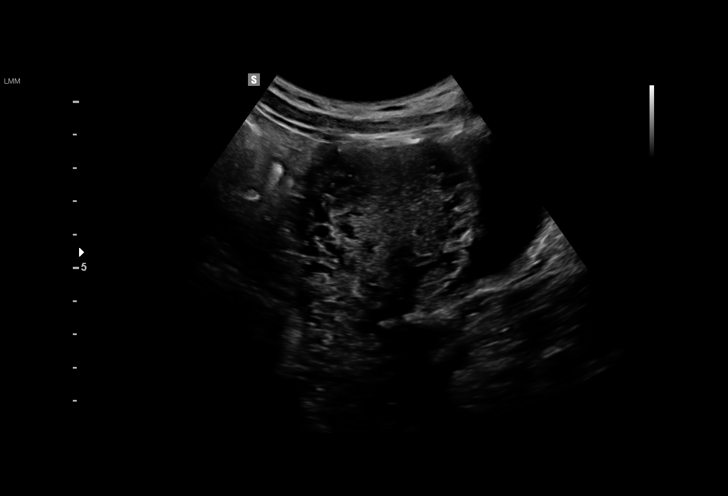
[im 9/55]
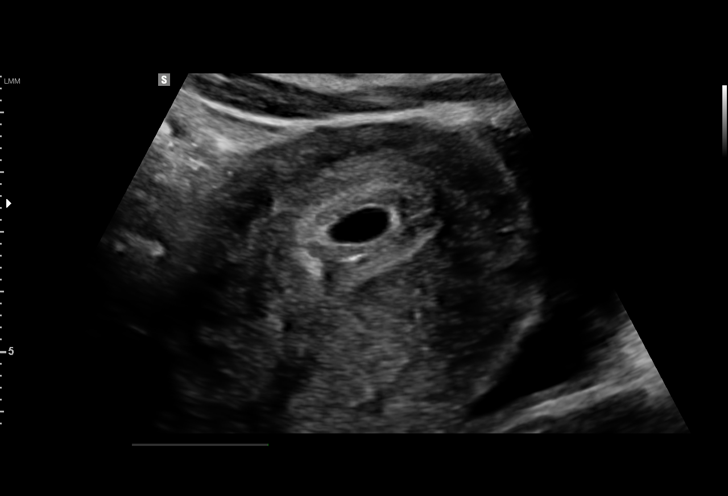
[im 13/55]
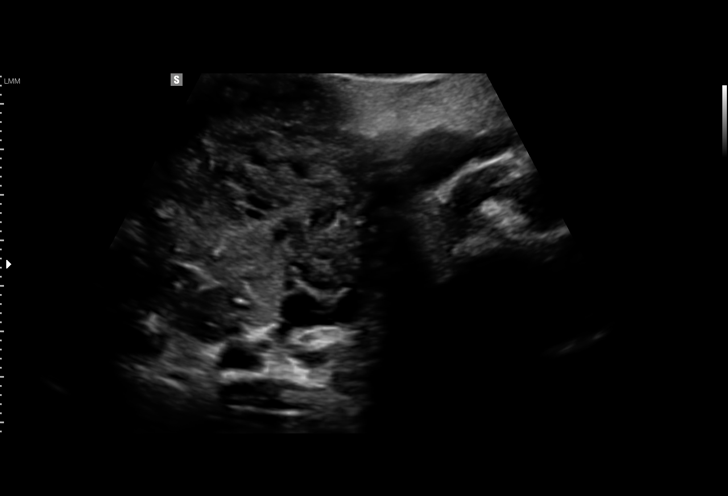
[im 17/55]
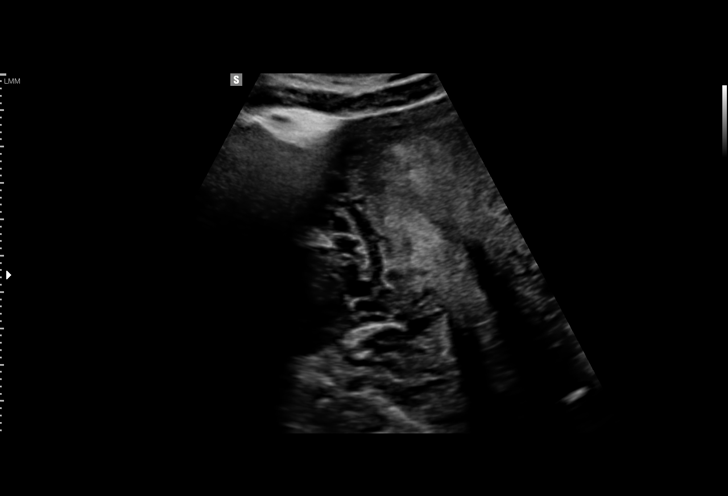
[im 21/55]
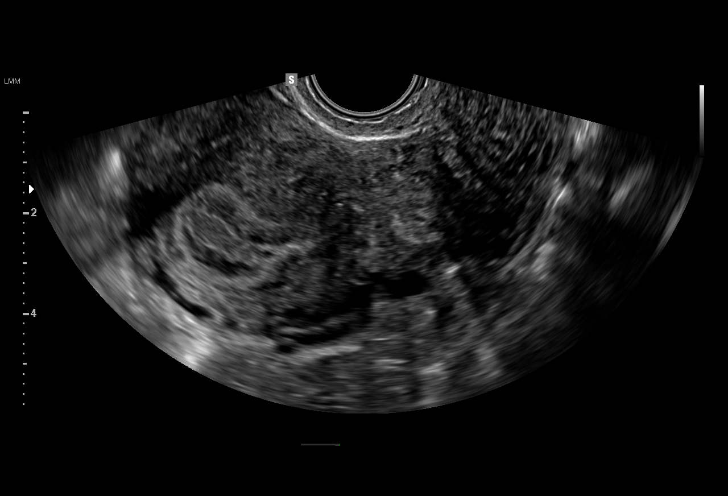
[im 25/55]
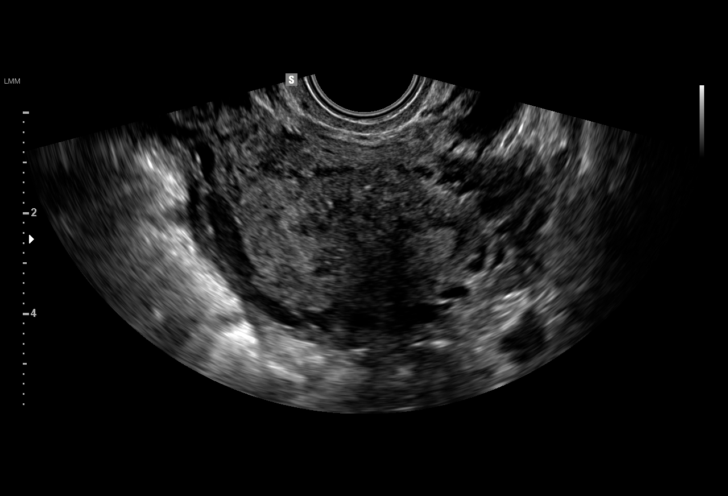
[im 29/55]
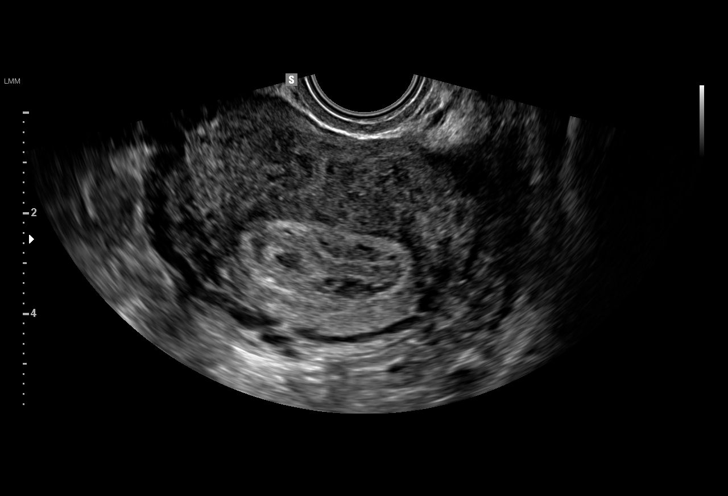
[im 31/55]
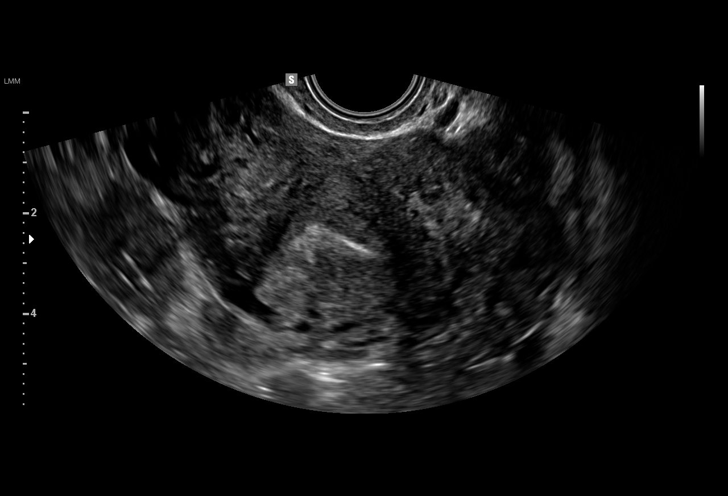
[im 35/55]
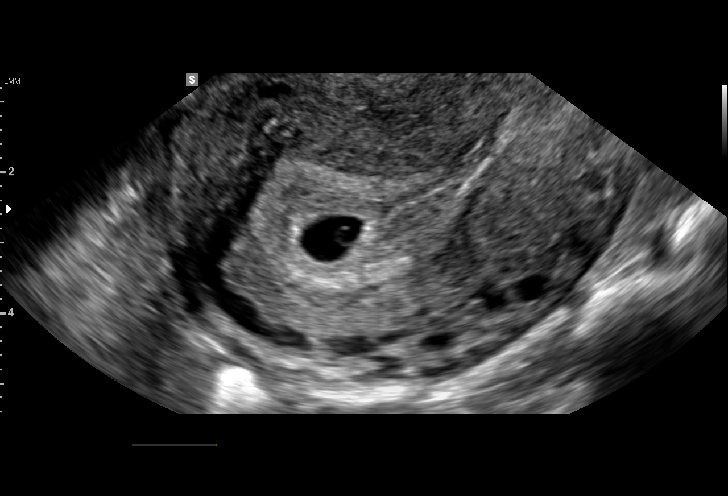
[im 39/55]
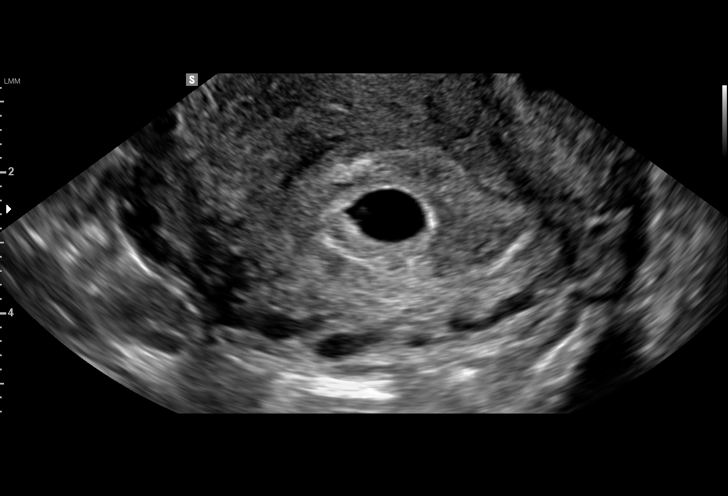
[im 43/55]
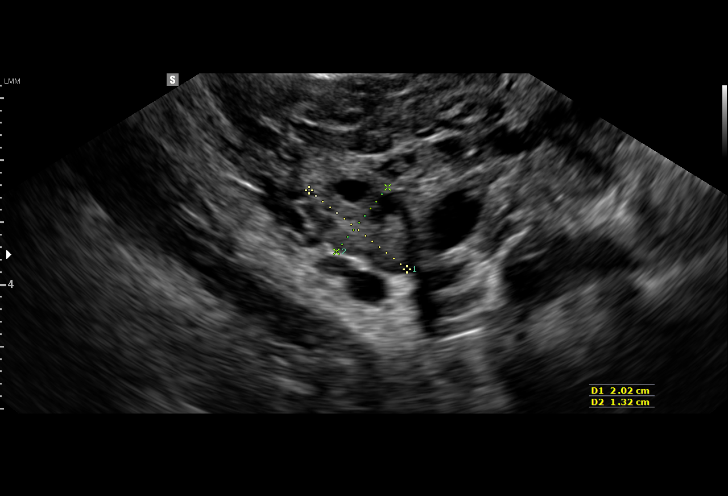
[im 47/55]
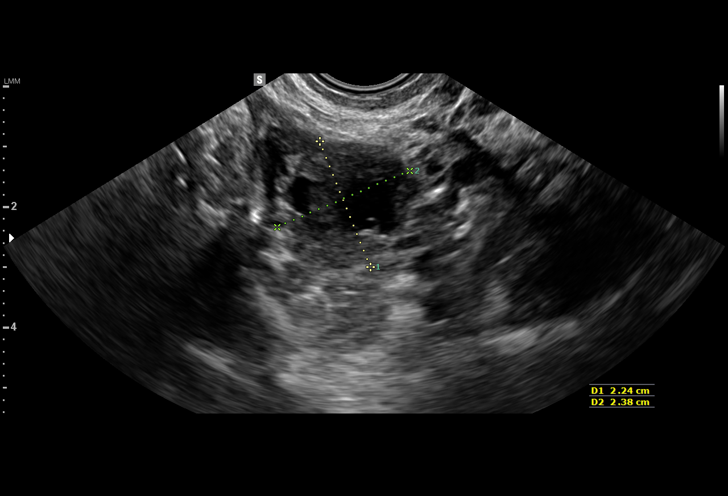
[im 51/55]
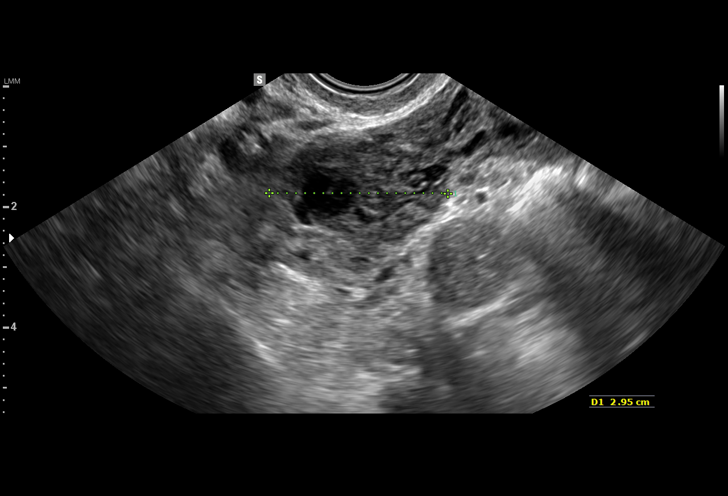
[im 55/55]
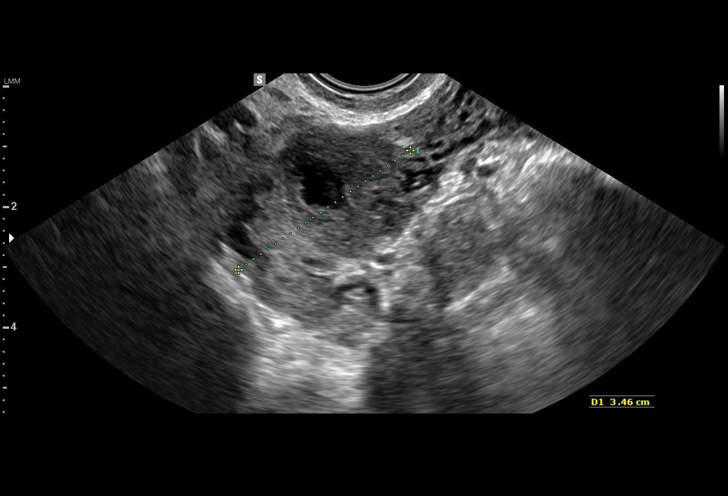

[15 of 28 positions shown; findings below may reference images not displayed]

FINDINGS: Intrauterine gestational sac: Single

Yolk sac:  Present

Embryo:  No

Cardiac Activity: No

Heart Rate: N/A

MSD: 9.9  mm   5 w   5  d

Subchorionic hemorrhage:  None visualized.

Maternal uterus/adnexae: Probable corpus luteum cyst in the right
ovary. Left ovary is normal in appearance. No significant free fluid
in the cul-de-sac.
IMPRESSION: 1. Probable early intrauterine gestational sac, but no yolk sac,
fetal pole, or cardiac activity yet visualized. Recommend follow-up
quantitative B-HCG levels and follow-up US in 14 days to confirm and
assess viability. This recommendation follows SRU consensus
guidelines: Diagnostic Criteria for Nonviable Pregnancy Early in the
First Trimester. N Engl J Med 2001; [DATE].

## 2017-10-20 IMAGING — US US MFM OB COMP +14 WKS
1 series · 14 of 28 positions shown · non-contrast
Comparison: none

[Series 1: us mfm ob comp +14 wks · 80 acquisitions, 14 frames shown]
[im 3/80]
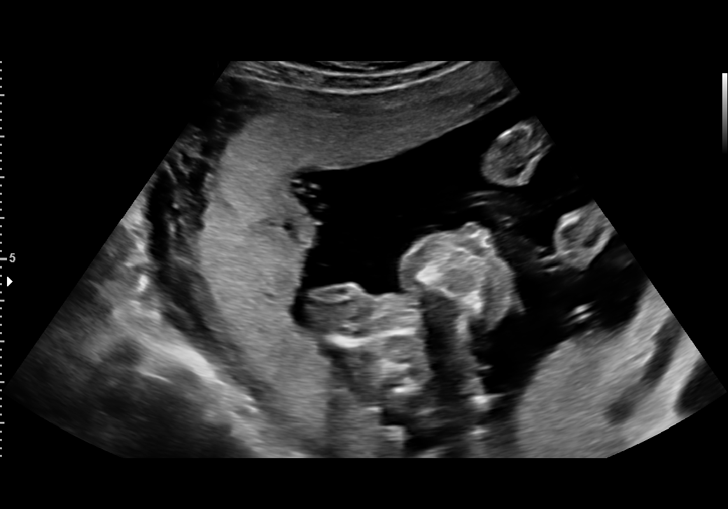
[im 9/80]
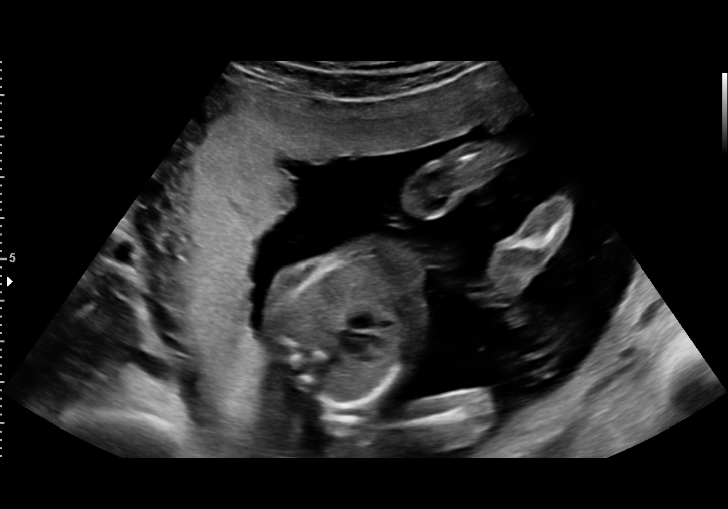
[im 15/80]
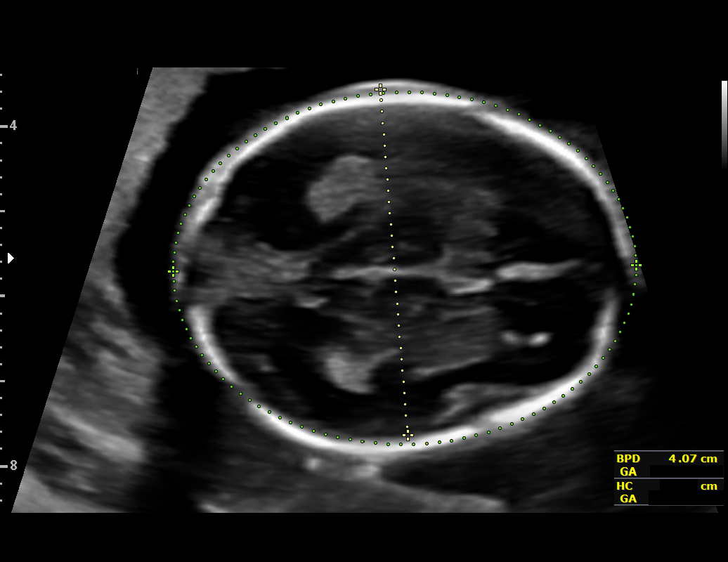
[im 21/80]
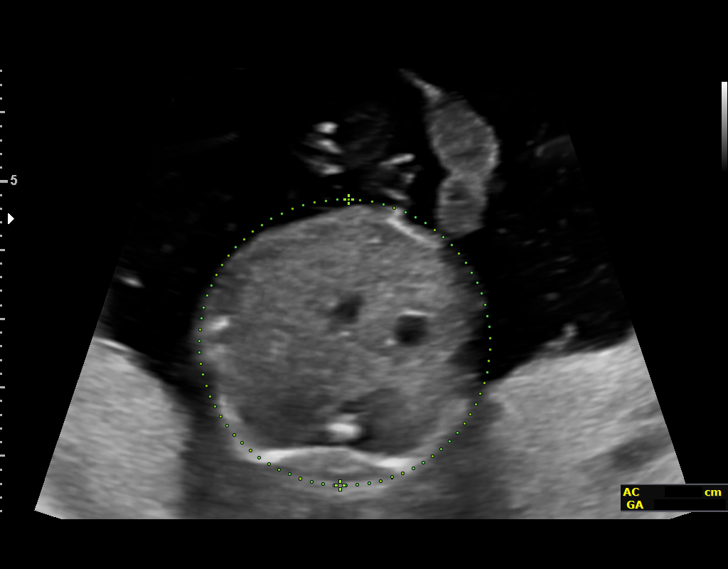
[im 27/80]
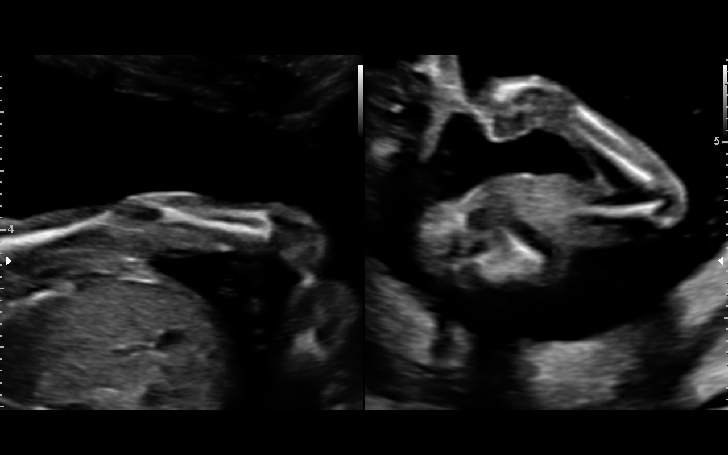
[im 33/80]
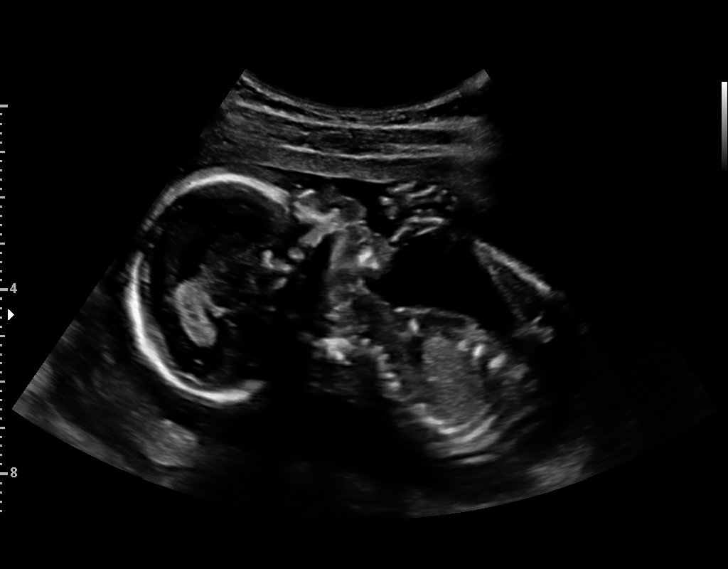
[im 39/80]
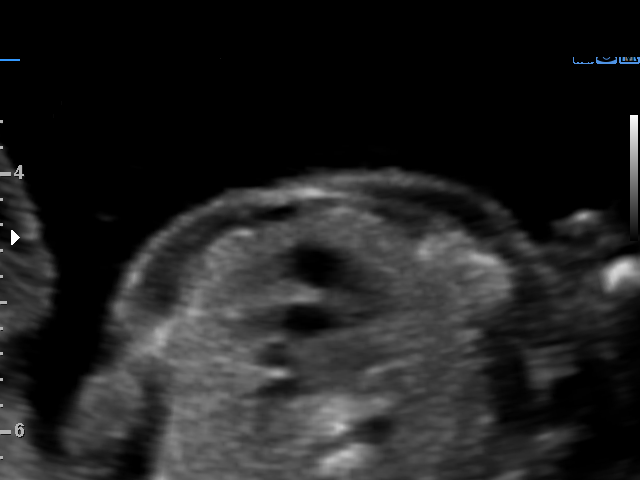
[im 44/80]
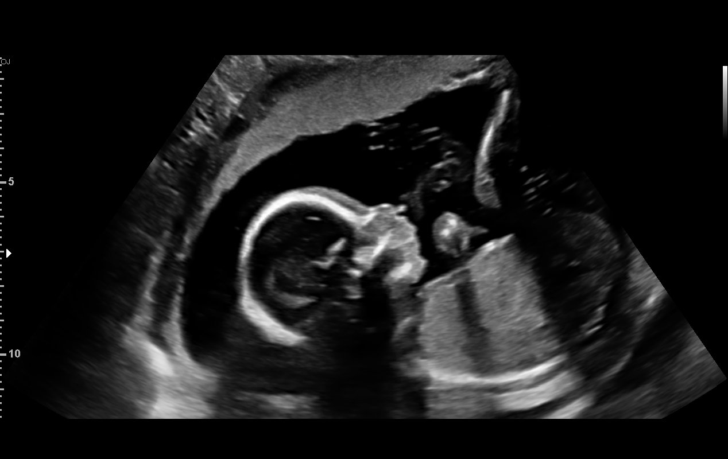
[im 50/80]
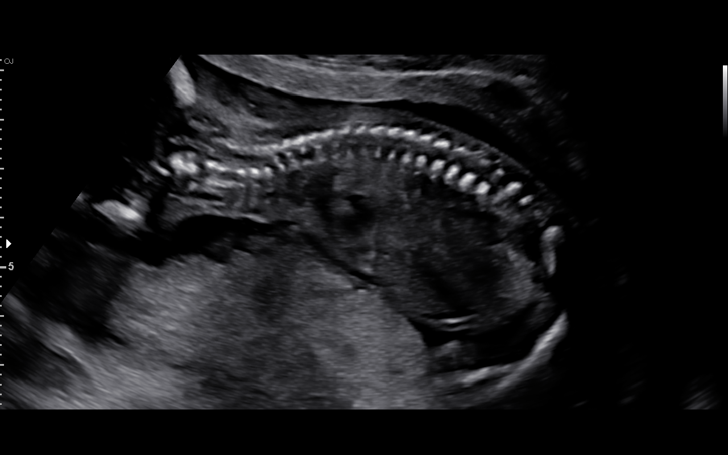
[im 56/80]
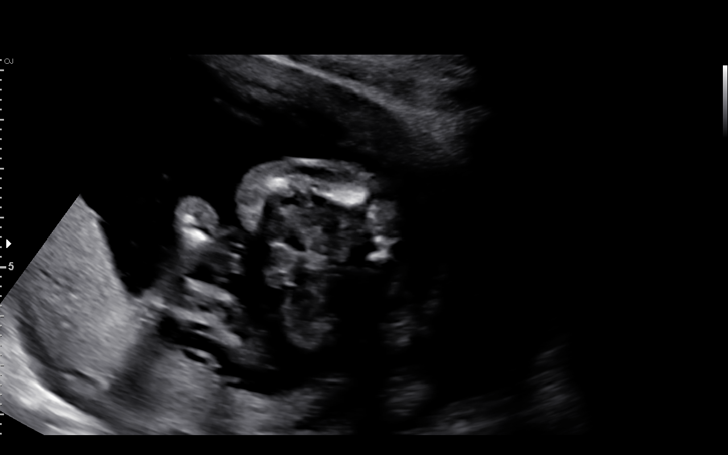
[im 62/80]
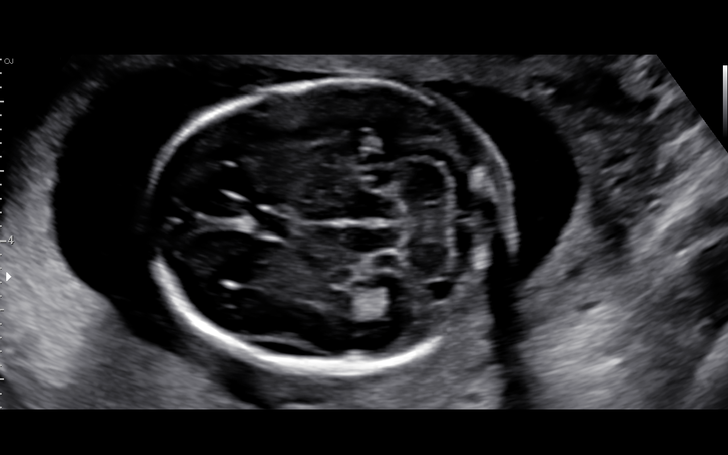
[im 68/80]
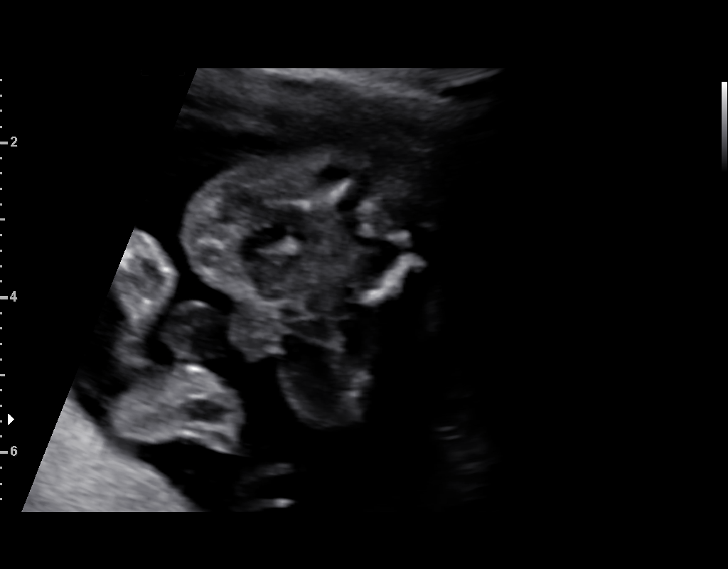
[im 74/80]
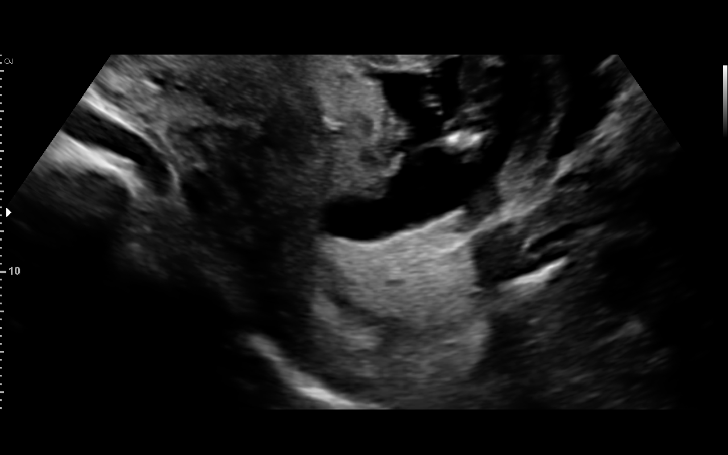
[im 80/80]
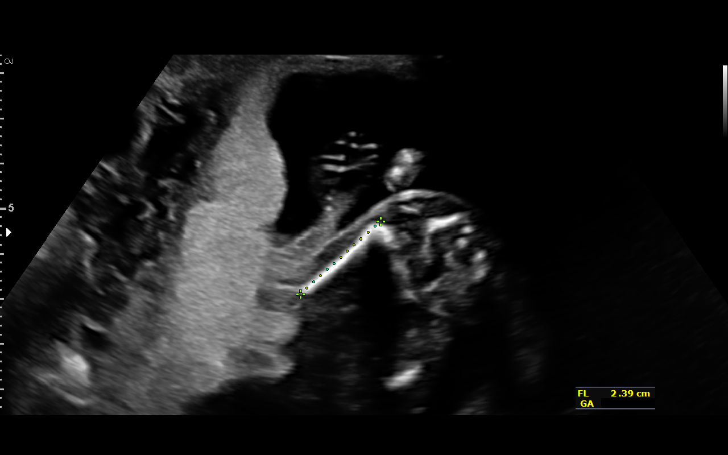

[14 of 28 positions shown; findings below may reference images not displayed]

1  KEONHO DALLAS                [id number]      [id number]     [id number]
Indications

17 weeks gestation of pregnancy
Encounter for antenatal screening for
malformations
OB History

Gravidity:    5         Term:   2        Prem:   0        SAB:   2
TOP:          0       Ectopic:  0        Living: 0
Fetal Evaluation

Num Of Fetuses:     1
Cardiac Activity:   Observed
Presentation:       Breech
Placenta:           Anterior Fundal, above cervical os
P. Cord Insertion:  Visualized

Amniotic Fluid
AFI FV:      Subjectively within normal limits

Largest Pocket(cm)
3.8
Biometry

BPD:      40.8  mm     G. Age:  18w 2d         83  %    CI:        74.88   %   70 - 86
FL/HC:      15.8   %   15.8 - 18
HC:      149.6  mm     G. Age:  18w 0d         65  %    HC/AC:      1.11       1.07 -
AC:      134.9  mm     G. Age:  19w 0d         88  %    FL/BPD:     57.8   %
FL:       23.6  mm     G. Age:  17w 1d         28  %    FL/AC:      17.5   %   20 - 24
HUM:      25.8  mm     G. Age:  18w 0d         71  %
CER:      19.4  mm     G. Age:  18w 5d         82  %
NFT:       4.5  mm
CM:        4.3  mm

Est. FW:     223  gm      0 lb 8 oz     60  %
Gestational Age

LMP:           17w 4d       Date:   09/24/15                 EDD:   06/30/16
U/S Today:     18w 1d                                        EDD:   06/26/16
Best:          17w 4d    Det. By:   LMP  (09/24/15)          EDD:   06/30/16
Anatomy

Cranium:               Appears normal         Aortic Arch:            Appears normal
Cavum:                 Appears normal         Ductal Arch:            Appears normal
Ventricles:            Appears normal         Diaphragm:              Appears normal
Choroid Plexus:        Appears normal         Stomach:                Appears normal, left
sided
Cerebellum:            Appears normal         Abdomen:                Appears normal
Posterior Fossa:       Appears normal         Abdominal Wall:         Appears nml (cord
insert, abd wall)
Nuchal Fold:           Appears normal         Cord Vessels:           Appears normal (3
vessel cord)
Face:                  Appears normal         Kidneys:                Appear normal
(orbits and profile)
Lips:                  Appears normal         Bladder:                Appears normal
Thoracic:              Appears normal         Spine:                  Appears normal
Heart:                 Appears normal         Upper Extremities:      Appears normal
(4CH, axis, and situs
RVOT:                  Appears normal         Lower Extremities:      Appears normal
LVOT:                  Appears normal

Other:  Parents do not wish to know sex of fetus. Technically difficult due to
fetal position.
Cervix Uterus Adnexa

Cervix
Length:            3.7  cm.
Normal appearance by transabdominal scan.

Uterus
No abnormality visualized.

Left Ovary
Within normal limits.

Right Ovary
Within normal limits.
Impression

SIUP at 17+4 weeks
Normal detailed fetal anatomy
Markers of aneuploidy: none
Normal amniotic fluid volume
Measurements consistent with LMP dating
Recommendations

Follow-up ultrasounds as clinically indicated.

## 2018-01-28 IMAGING — DX DG FOOT COMPLETE 3+V*L*
3 series · 3 of 3 positions shown · non-contrast
Comparison: None.

CLINICAL DATA: Pain for 3 days

EXAM:
LEFT FOOT - COMPLETE 3+ VIEW

[foot ap]
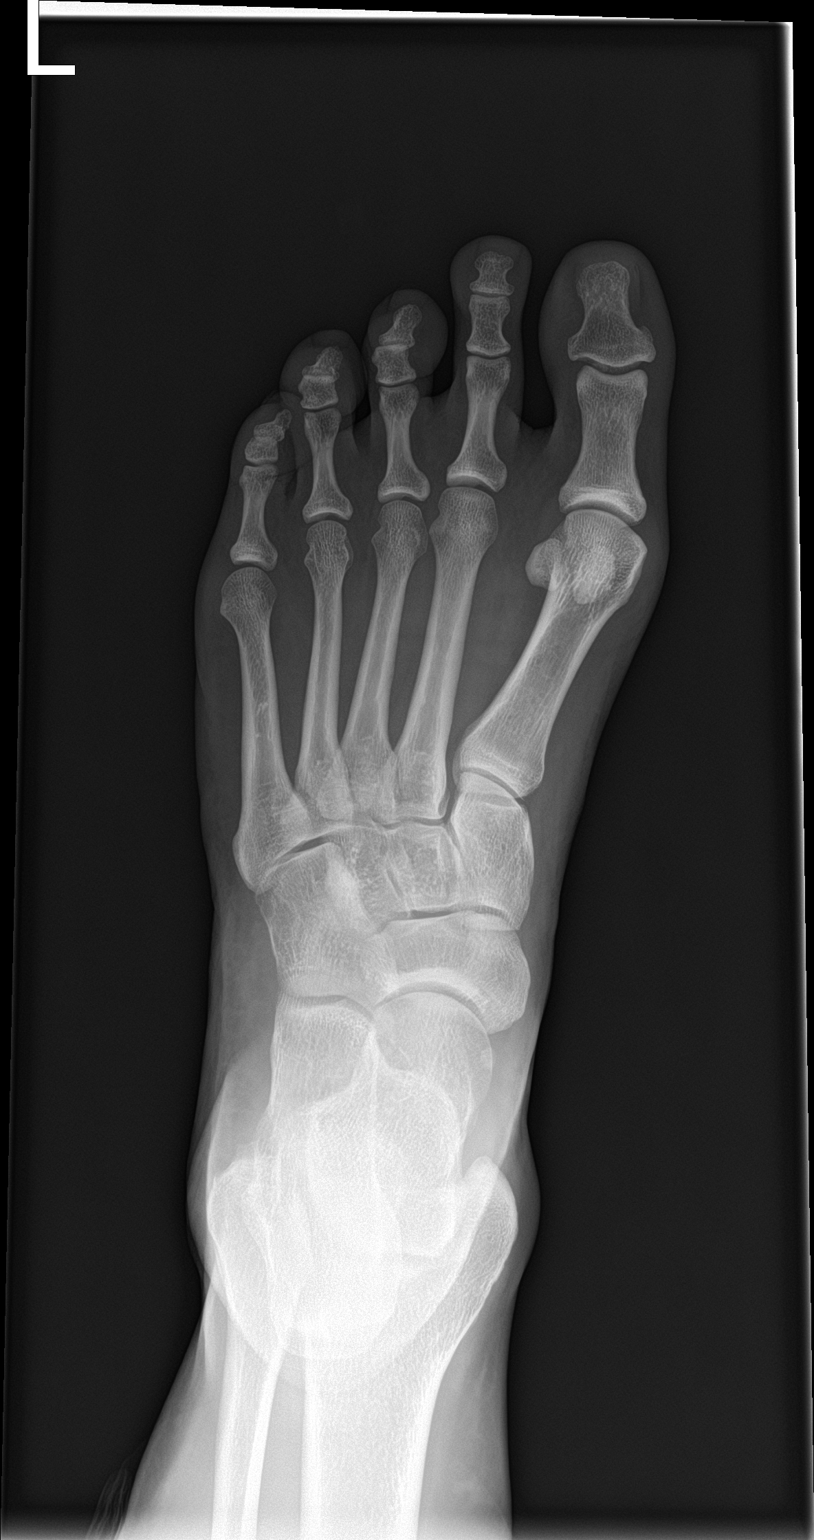

[foot obl]
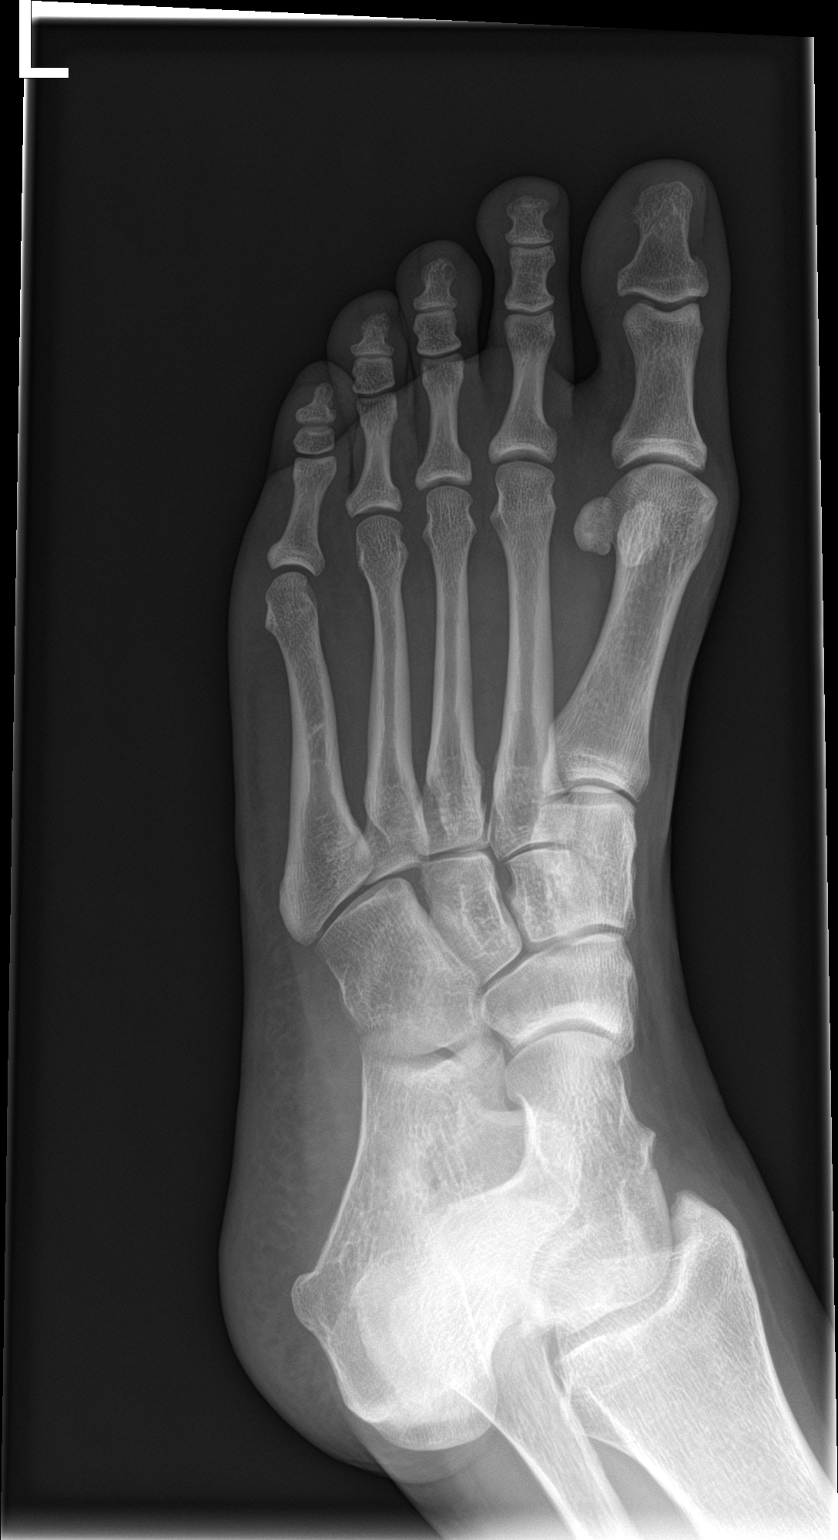

[foot lat]
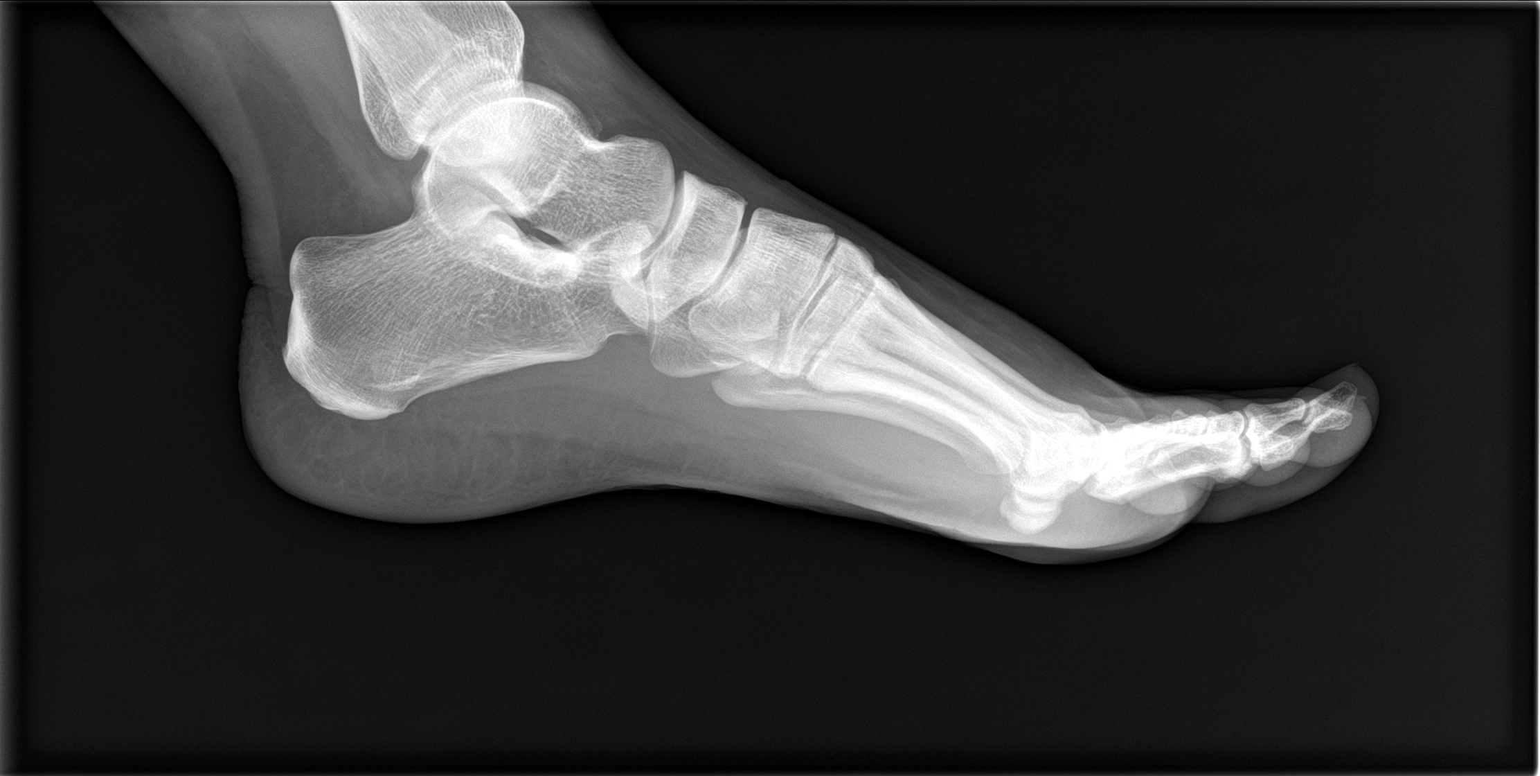

[3 of 3 positions shown; findings below may reference images not displayed]

FINDINGS: Frontal, oblique, and lateral views obtained. There is no fracture
or dislocation. Joint spaces appear normal. No erosive change.
IMPRESSION: No fracture or dislocation.  No evident arthropathy.
